# Patient Record
Sex: Female | Born: 1975 | Race: Black or African American | Hispanic: No | Marital: Married | Smoking: Never smoker
Health system: Southern US, Community
[De-identification: ages and names within clinical notes are randomized; demographics above are authoritative.]

---

## 1997-05-30 ENCOUNTER — Ambulatory Visit (HOSPITAL_COMMUNITY): Admission: RE | Admit: 1997-05-30 | Discharge: 1997-05-30 | Payer: Self-pay | Admitting: Obstetrics

## 1997-11-13 ENCOUNTER — Encounter (HOSPITAL_COMMUNITY): Admission: RE | Admit: 1997-11-13 | Discharge: 1997-11-20 | Payer: Self-pay | Admitting: Obstetrics & Gynecology

## 1997-11-18 ENCOUNTER — Inpatient Hospital Stay (HOSPITAL_COMMUNITY): Admission: AD | Admit: 1997-11-18 | Discharge: 1997-11-20 | Payer: Self-pay | Admitting: *Deleted

## 1999-08-19 ENCOUNTER — Other Ambulatory Visit: Admission: RE | Admit: 1999-08-19 | Discharge: 1999-08-19 | Payer: Self-pay | Admitting: Obstetrics and Gynecology

## 2000-02-25 ENCOUNTER — Inpatient Hospital Stay (HOSPITAL_COMMUNITY): Admission: AD | Admit: 2000-02-25 | Discharge: 2000-02-27 | Payer: Self-pay | Admitting: Obstetrics and Gynecology

## 2001-08-18 ENCOUNTER — Emergency Department (HOSPITAL_COMMUNITY): Admission: EM | Admit: 2001-08-18 | Discharge: 2001-08-18 | Payer: Self-pay | Admitting: Emergency Medicine

## 2002-01-08 ENCOUNTER — Encounter: Payer: Self-pay | Admitting: Emergency Medicine

## 2002-01-08 ENCOUNTER — Emergency Department (HOSPITAL_COMMUNITY): Admission: EM | Admit: 2002-01-08 | Discharge: 2002-01-08 | Payer: Self-pay | Admitting: Emergency Medicine

## 2003-03-05 ENCOUNTER — Emergency Department (HOSPITAL_COMMUNITY): Admission: EM | Admit: 2003-03-05 | Discharge: 2003-03-05 | Payer: Self-pay | Admitting: Emergency Medicine

## 2003-08-21 ENCOUNTER — Other Ambulatory Visit: Admission: RE | Admit: 2003-08-21 | Discharge: 2003-08-21 | Payer: Self-pay | Admitting: Family Medicine

## 2003-09-29 ENCOUNTER — Encounter: Admission: RE | Admit: 2003-09-29 | Discharge: 2003-09-29 | Payer: Self-pay | Admitting: Gastroenterology

## 2004-03-25 ENCOUNTER — Other Ambulatory Visit: Admission: RE | Admit: 2004-03-25 | Discharge: 2004-03-25 | Payer: Self-pay | Admitting: Family Medicine

## 2007-05-06 ENCOUNTER — Emergency Department (HOSPITAL_COMMUNITY): Admission: EM | Admit: 2007-05-06 | Discharge: 2007-05-06 | Payer: Self-pay | Admitting: Family Medicine

## 2007-05-10 ENCOUNTER — Emergency Department (HOSPITAL_COMMUNITY): Admission: EM | Admit: 2007-05-10 | Discharge: 2007-05-10 | Payer: Self-pay | Admitting: Family Medicine

## 2010-09-13 NOTE — H&P (Signed)
[facility]  Patient:    Carolyn Salas, Carolyn Salas                        MRN: [id number] Adm. Date:  [id number] Attending:  Pleas Koch Dictator:   Wynelle Bourgeois, P.A.                         History and Physical  HISTORY OF PRESENT ILLNESS:   Eknoor is a 35 year old G2, para 1-0-0-1 at 40-1/7ths weeks, who presents with complaints of uterine contractions every 2-3 minutes since 3 a.m.  She denies any leaking or bleeding and reports positive fetal movement.  The pregnancy has been followed by Dr. Pennie Rushing at [facility] and has been remarkable for: 1. A history of group B strep previous pregnancy.  2. Allergic to penicillin.  3. Unknown last menstrual period.  4. Rubella nonimmune.  PRENATAL LABORATORIES:        Hemoglobin 12.4, hematocrit 36.3, platelets 245. Blood type A positive.  Antibody screen negative.  Sickle cell trait negative. RPR nonreactive.  Rubella nonimmune.  HBsAg negative.  HIV nonreactive. Gonorrhea negative.  Chlamydia negative.  HSV negative culture.  AFP free beta within normal limits.  Glucose challenge within normal limits.  OBSTETRICAL HISTORY:          Remarkable for a spontaneous vaginal delivery in July 1999, of a viable female infant named Carlena Sax at [redacted] weeks gestation weighing 7 pounds 5 ounces after 12 hours of labor, complicated by beta strep.  MEDICAL HISTORY:              Remarkable for childhood varicella and a remote history of anemia and constipation.  FAMILY HISTORY:               Remarkable only for maternal grandmother with hypertension.  GENETIC HISTORY:              Unremarkable.  SOCIAL HISTORY:               The patient is married to Dwana Melena who is involved and supportive.  She is of the [place] faith.  She works as a Haematologist.  She denies any alcohol, tobacco or drug use.  PHYSICAL EXAMINATION:  VITAL SIGNS:                  Temperature 98.3, 72, 16 and 116/70.  HEENT:                         Within normal limits.  NECK:                         Thyroid normal, not enlarged.  BREASTS:                      Soft, nontender, no masses.  CHEST:                        Clear to auscultation bilaterally.  HEART RATE:                   Regular rate and rhythm.  ABDOMEN:                      Gravid at 39 cm, vertex to [place].  Fetal monitor denotes borderline reactive fetal heart rate tracing with  baseline 130-140 with accelerations to 150, no decelerations.  Uterine contractions every 2-3 minutes strong.  PELVIC:                       Cervical exam is 3-4 cm, 90% effaced, -1 station with a bulging bag of water and a vertex presentation.  EXTREMITIES:                  Within normal limits.  ASSESSMENT:                   1. Intrauterine pregnancy at 40-1/7ths weeks.                               2. Labor.                               3. History of group B Streptococcus with                                  penicillin allergy.  PLAN:                         1. Admit to birthing suite, Dr. Elliot Gault notified.                               2. Routine M.D. orders.                               3. Epidural when 4 cm.                               4. Clindamycin prophylaxis for group B strep and                                  further orders to be determined later. DD:  02/25/00 TD:  02/25/00 Job: [id number]

## 2016-06-19 ENCOUNTER — Other Ambulatory Visit: Payer: Self-pay | Admitting: Obstetrics and Gynecology

## 2016-06-19 DIAGNOSIS — R928 Other abnormal and inconclusive findings on diagnostic imaging of breast: Secondary | ICD-10-CM

## 2016-07-22 ENCOUNTER — Other Ambulatory Visit: Payer: Self-pay

## 2016-07-23 ENCOUNTER — Ambulatory Visit
Admission: RE | Admit: 2016-07-23 | Discharge: 2016-07-23 | Disposition: A | Discharge status: Home / Self Care | Payer: BC Managed Care – PPO | Source: Ambulatory Visit | Attending: Obstetrics and Gynecology | Admitting: Obstetrics and Gynecology

## 2016-07-23 DIAGNOSIS — R928 Other abnormal and inconclusive findings on diagnostic imaging of breast: Secondary | ICD-10-CM

## 2019-11-23 ENCOUNTER — Other Ambulatory Visit: Payer: Self-pay | Admitting: Obstetrics and Gynecology

## 2019-11-23 DIAGNOSIS — R928 Other abnormal and inconclusive findings on diagnostic imaging of breast: Secondary | ICD-10-CM

## 2020-01-11 ENCOUNTER — Ambulatory Visit: Payer: BC Managed Care – PPO

## 2020-01-11 ENCOUNTER — Ambulatory Visit
Admission: RE | Admit: 2020-01-11 | Discharge: 2020-01-11 | Disposition: A | Discharge status: Home / Self Care | Payer: BC Managed Care – PPO | Source: Ambulatory Visit | Attending: Obstetrics and Gynecology | Admitting: Obstetrics and Gynecology

## 2020-01-11 ENCOUNTER — Other Ambulatory Visit: Payer: Self-pay

## 2020-01-11 DIAGNOSIS — R928 Other abnormal and inconclusive findings on diagnostic imaging of breast: Secondary | ICD-10-CM

## 2021-10-25 ENCOUNTER — Ambulatory Visit: Payer: Self-pay | Admitting: Internal Medicine

## 2021-10-30 NOTE — Progress Notes (Unsigned)
New Patient Note  RE: Carolyn Salas MRN: [id number] DOB: October 17, 1975 Date of Office Visit: 10/31/2021  Consult requested by: Deatra James, MD Primary care provider: Zelphia Cairo, MD  Chief Complaint: No chief complaint on file.  History of Present Illness: I had the pleasure of seeing Carolyn Salas for initial evaluation at the [facility] on 10/30/2021. She is a 46 y.o. female, who is referred here by Zelphia Cairo, MD for the evaluation of allergic rhinitis.  She reports symptoms of ***. Symptoms have been going on for *** years. The symptoms are present *** all year around with worsening in ***. Other triggers include exposure to ***. Anosmia: ***. Headache: ***. She has used *** with ***fair improvement in symptoms. Sinus infections: ***. Previous work up includes: ***. Previous ENT evaluation: ***. Previous sinus imaging: ***. History of nasal polyps: ***. Last eye exam: ***. History of reflux: ***.  Assessment and Plan: Carolyn Salas is a 46 y.o. female with: No problem-specific Assessment & Plan notes found for this encounter.  No follow-ups on file.  No orders of the defined types were placed in this encounter.  Lab Orders  No laboratory test(s) ordered today    Other allergy screening: Asthma: {Blank single:19197::"yes","no"} Rhino conjunctivitis: {Blank single:19197::"yes","no"} Food allergy: {Blank single:19197::"yes","no"} Medication allergy: {Blank single:19197::"yes","no"} Hymenoptera allergy: {Blank single:19197::"yes","no"} Urticaria: {Blank single:19197::"yes","no"} Eczema:{Blank single:19197::"yes","no"} History of recurrent infections suggestive of immunodeficency: {Blank single:19197::"yes","no"}  Diagnostics: Spirometry:  Tracings reviewed. Her effort: {Blank single:19197::"Good reproducible efforts.","It was hard to get consistent efforts and there is a question as to whether this reflects a maximal maneuver.","Poor effort, data can  not be interpreted."} FVC: ***L FEV1: ***L, ***% predicted FEV1/FVC ratio: ***% Interpretation: {Blank single:19197::"Spirometry consistent with mild obstructive disease","Spirometry consistent with moderate obstructive disease","Spirometry consistent with severe obstructive disease","Spirometry consistent with possible restrictive disease","Spirometry consistent with mixed obstructive and restrictive disease","Spirometry uninterpretable due to technique","Spirometry consistent with normal pattern","No overt abnormalities noted given today's efforts"}.  Please see scanned spirometry results for details.  Skin Testing: {Blank single:19197::"Select foods","Environmental allergy panel","Environmental allergy panel and select foods","Food allergy panel","None","Deferred due to recent antihistamines use"}. *** Results discussed with patient/family.   Past Medical History: There are no problems to display for this patient.  No past medical history on file. Past Surgical History: No past surgical history on file. Medication List:  No current outpatient medications on file.   No current facility-administered medications for this visit.   Allergies: Not on File Social History: Social History   Socioeconomic History  . Marital status: Divorced    Spouse name: Not on file  . Number of children: Not on file  . Years of education: Not on file  . Highest education level: Not on file  Occupational History  . Not on file  Tobacco Use  . Smoking status: Not on file  . Smokeless tobacco: Not on file  Substance and Sexual Activity  . Alcohol use: Not on file  . Drug use: Not on file  . Sexual activity: Not on file  Other Topics Concern  . Not on file  Social History Narrative  . Not on file   Social Determinants of Health   Financial Resource Strain: Not on file  Food Insecurity: Not on file  Transportation Needs: Not on file  Physical Activity: Not on file  Stress: Not on file   Social Connections: Not on file   Lives in a ***. Smoking: *** Occupation: ***  Environmental History: Water Damage/mildew in the house: [employer]  single:19197::"yes","no"} Carpet in the family room: {Blank single:19197::"yes","no"} Carpet in the bedroom: {Blank single:19197::"yes","no"} Heating: {Blank single:19197::"electric","gas","heat pump"} Cooling: {Blank single:19197::"central","window","heat pump"} Pet: {Blank single:19197::"yes ***","no"}  Family History: No family history on file. Problem                               Relation Asthma                                   *** Eczema                                *** Food allergy                          *** Allergic rhino conjunctivitis     ***  Review of Systems  Constitutional:  Negative for appetite change, chills, fever and unexpected weight change.  HENT:  Negative for congestion and rhinorrhea.   Eyes:  Negative for itching.  Respiratory:  Negative for cough, chest tightness, shortness of breath and wheezing.   Cardiovascular:  Negative for chest pain.  Gastrointestinal:  Negative for abdominal pain.  Genitourinary:  Negative for difficulty urinating.  Skin:  Negative for rash.  Neurological:  Negative for headaches.   Objective: There were no vitals taken for this visit. There is no height or weight on file to calculate BMI. Physical Exam Vitals and nursing note reviewed.  Constitutional:      Appearance: Normal appearance. She is well-developed.  HENT:     Head: Normocephalic and atraumatic.     Right Ear: Tympanic membrane and external ear normal.     Left Ear: Tympanic membrane and external ear normal.     Nose: Nose normal.     Mouth/Throat:     Mouth: Mucous membranes are moist.     Pharynx: Oropharynx is clear.  Eyes:     Conjunctiva/sclera: Conjunctivae normal.  Cardiovascular:     Rate and Rhythm: Normal rate and regular rhythm.     Heart sounds: Normal heart sounds. No murmur heard.    No  friction rub. No gallop.  Pulmonary:     Effort: Pulmonary effort is normal.     Breath sounds: Normal breath sounds. No wheezing, rhonchi or rales.  Musculoskeletal:     Cervical back: Neck supple.  Skin:    General: Skin is warm.     Findings: No rash.  Neurological:     Mental Status: She is alert and oriented to person, place, and time.  Psychiatric:        Behavior: Behavior normal.  The plan was reviewed with the patient/family, and all questions/concerned were addressed.  It was my pleasure to see Carolyn Salas today and participate in her care. Please feel free to contact me with any questions or concerns.  Sincerely,  Wyline Mood, DO Allergy & Immunology  [facility] [place] office: [phone] [facility] office: [phone]

## 2021-10-31 ENCOUNTER — Encounter: Payer: Self-pay | Admitting: Allergy

## 2021-10-31 ENCOUNTER — Ambulatory Visit (INDEPENDENT_AMBULATORY_CARE_PROVIDER_SITE_OTHER): Payer: BC Managed Care – PPO | Admitting: Allergy

## 2021-10-31 ENCOUNTER — Other Ambulatory Visit: Payer: Self-pay

## 2021-10-31 VITALS — BP 120/72 | HR 68 | Temp 98.0°F | Resp 20 | Ht 67.0 in | Wt 187.4 lb

## 2021-10-31 DIAGNOSIS — J310 Chronic rhinitis: Secondary | ICD-10-CM | POA: Diagnosis not present

## 2021-10-31 MED ORDER — RYALTRIS 665-25 MCG/ACT NA SUSP: 1.0000 | Freq: Two times a day (BID) | NASAL | 5 refills | Status: DC

## 2021-10-31 NOTE — Patient Instructions (Addendum)
Today's skin testing showed: Negative to indoor/outdoor allergens but positive control was negative questioning the validity of results.   Results given.  Rhinitis Get bloodwork and will make additional recommendations based on results.  We are ordering labs, so please allow 1-2 weeks for the results to come back. With the newly implemented Cures Act, the labs might be visible to you at the same time that they become visible to me. However, I will not address the results until all of the results are back, so please be patient.  In the meantime, continue recommendations in your patient instructions, including avoidance measures (if applicable), until you hear from me. Use over the counter antihistamines such as Zyrtec (cetirizine), Claritin (loratadine), Allegra (fexofenadine), or Xyzal (levocetirizine) daily as needed. May take twice a day during allergy flares. May switch antihistamines every few months. Use plain antihistamines without the decongestants.  Start Ryaltris (olopatadine + mometasone nasal spray combination) 1-2 sprays per nostril twice a day. Sample given. This replaces your other nasal sprays. If this works well for you, then have [facility] ship the medication to your home - prescription already sent in.  Nasal saline spray (i.e., Simply Saline) or nasal saline lavage (i.e., NeilMed) is recommended as needed and prior to medicated nasal sprays.  Follow up in 4 months or sooner if needed.

## 2021-10-31 NOTE — Assessment & Plan Note (Addendum)
Rhinoconjunctivitis symptoms mainly in the spring for the last 10 years.  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  Tried Allegra, Xyzal and Flonase with minimal benefit.  No prior allergy/ENT evaluation.  Today's skin prick testing showed: Negative to indoor/outdoor allergens but positive control was negative questioning the validity of results.   Get bloodwork and will make additional recommendations based on results.   Use over the counter antihistamines such as Zyrtec (cetirizine), Claritin (loratadine), Allegra (fexofenadine), or Xyzal (levocetirizine) daily as needed. May take twice a day during allergy flares. May switch antihistamines every few months.  Use plain antihistamines without the decongestants.   Start Ryaltris (olopatadine + mometasone nasal spray combination) 1-2 sprays per nostril twice a day. Sample given.  This replaces your other nasal sprays.  If this works well for you, then have [facility] ship the medication to your home - prescription already sent in.   Nasal saline spray (i.e., Simply Saline) or nasal saline lavage (i.e., NeilMed) is recommended as needed and prior to medicated nasal sprays.

## 2021-11-03 LAB — ALLERGENS W/TOTAL IGE AREA 2
Alternaria Alternata IgE: <0.1 kU/L
Aspergillus Fumigatus IgE: <0.1 kU/L
Bermuda Grass IgE: <0.1 kU/L
Cat Dander IgE: <0.1 kU/L
Cedar, Mountain IgE: 1.43 kU/L — AB
Cladosporium Herbarum IgE: <0.1 kU/L
Cockroach, German IgE: <0.1 kU/L
Common Silver Birch IgE: <0.1 kU/L
Cottonwood IgE: <0.1 kU/L
D Farinae IgE: <0.1 kU/L
D Pteronyssinus IgE: <0.1 kU/L
Dog Dander IgE: <0.1 kU/L
Elm, American IgE: <0.1 kU/L
IgE (Immunoglobulin E), Serum: 12 IU/mL (ref 6–495)
Johnson Grass IgE: <0.1 kU/L
Maple/Box Elder IgE: <0.1 kU/L
Mouse Urine IgE: <0.1 kU/L
Oak, White IgE: <0.1 kU/L
Pecan, Hickory IgE: <0.1 kU/L
Penicillium Chrysogen IgE: <0.1 kU/L
Pigweed, Rough IgE: <0.1 kU/L
Ragweed, Short IgE: <0.1 kU/L
Sheep Sorrel IgE Qn: <0.1 kU/L
Timothy Grass IgE: <0.1 kU/L
White Mulberry IgE: <0.1 kU/L

## 2021-11-19 ENCOUNTER — Encounter: Payer: Self-pay | Admitting: *Deleted

## 2021-12-17 IMAGING — MG MM DIGITAL DIAGNOSTIC UNILAT*L* W/ TOMO W/ CAD
6 series · 6 of 18 positions shown · non-contrast
Comparison: Previous exam(s).

CLINICAL DATA: Patient recalled from screening for left breast
asymmetry.

EXAM:
DIGITAL DIAGNOSTIC UNILATERAL LEFT MAMMOGRAM WITH TOMO AND CAD

[L MLO synth-2D (1 of 2)]
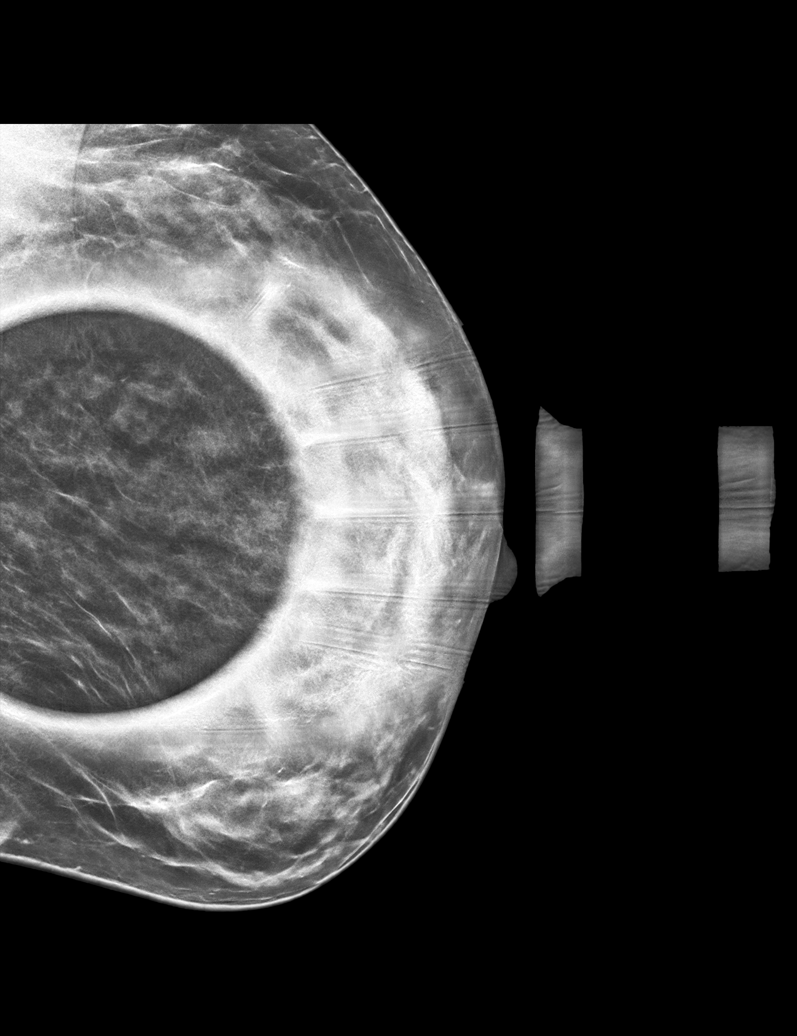

[L MLO synth-2D (2 of 2)]
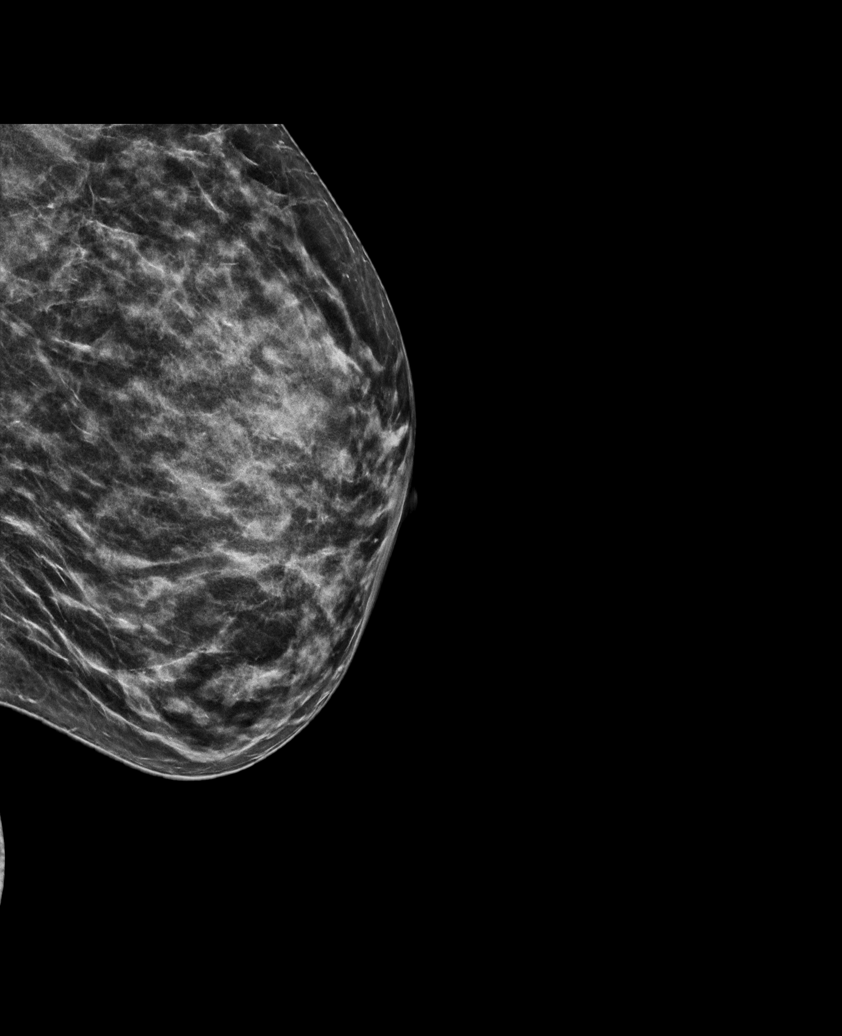

[L ML synth-2D]
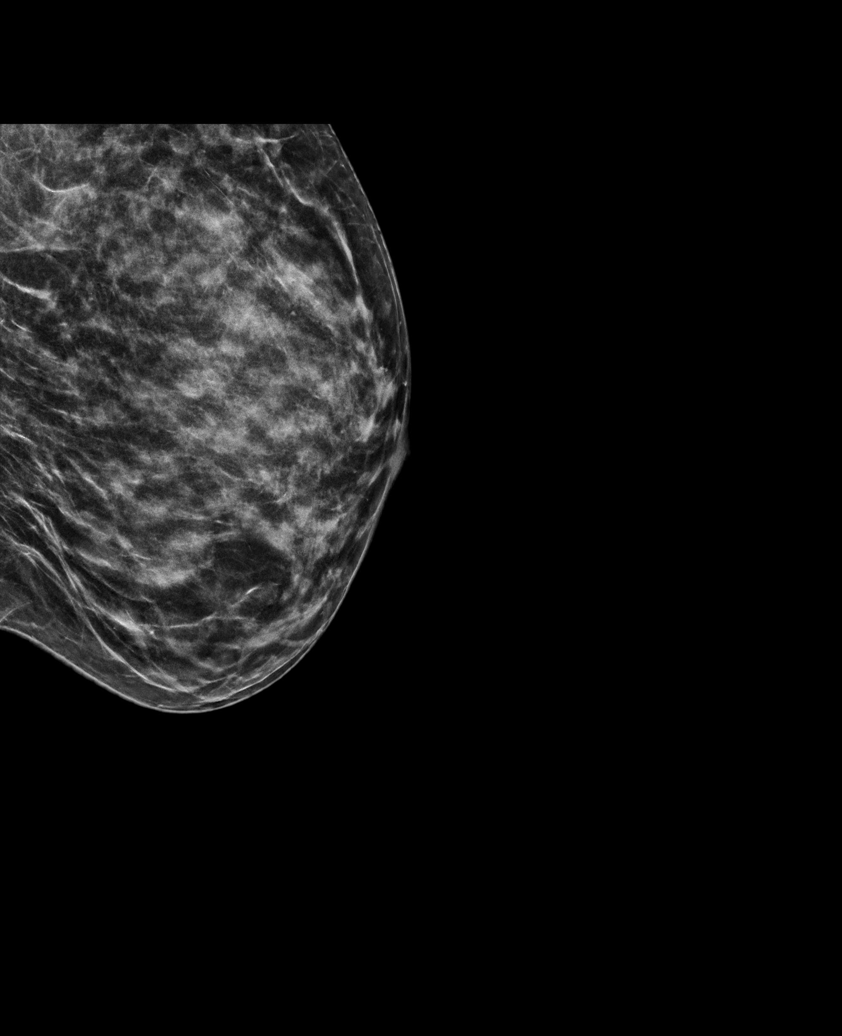

[L MLO tomo (1 of 2) · tomo slice 27/53.0]
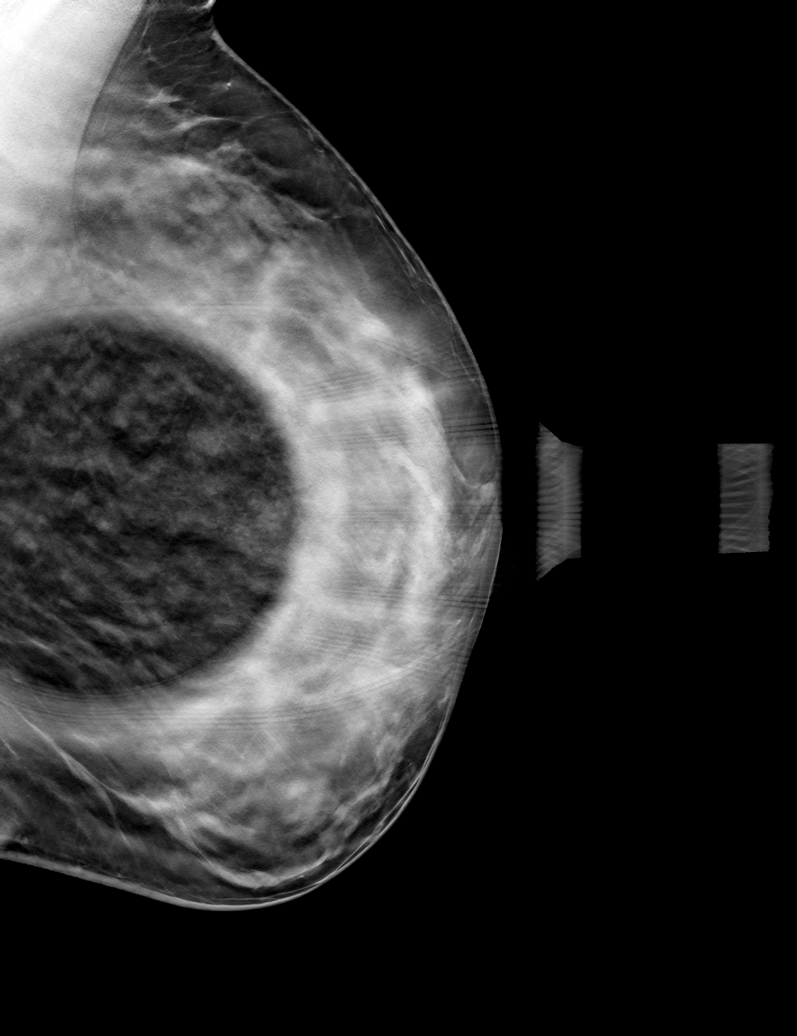

[L ML tomo · tomo slice 29/56.0]
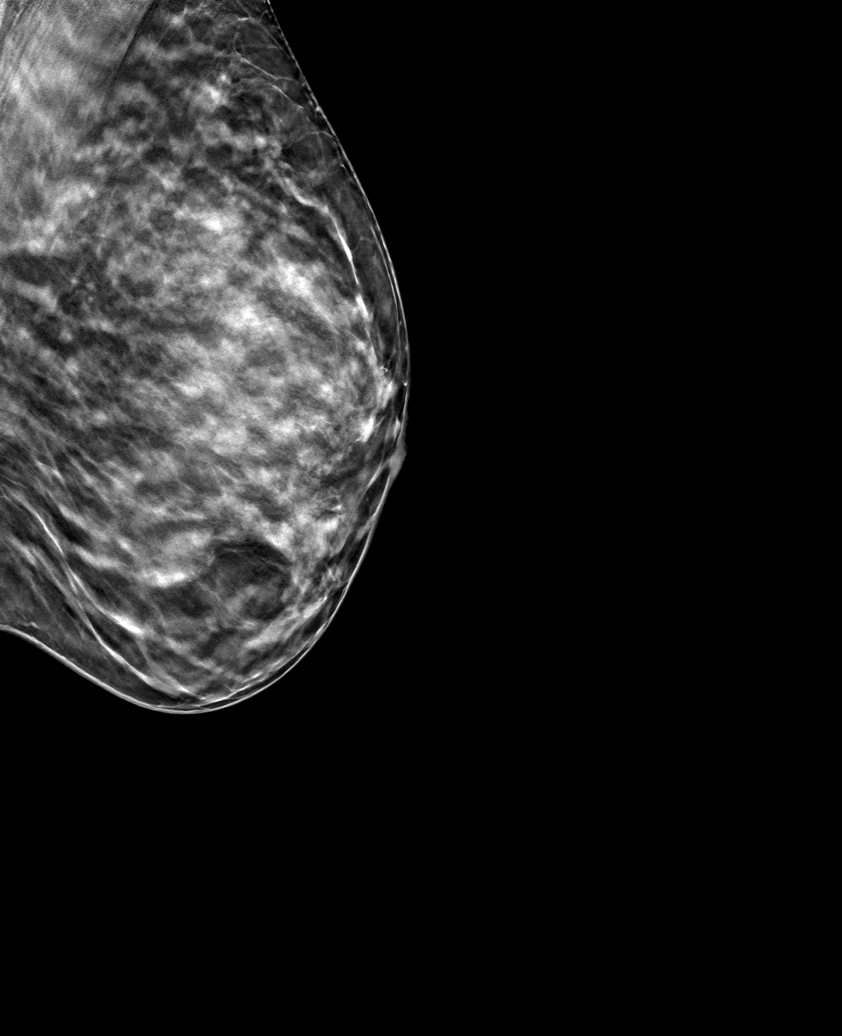

[L MLO tomo (2 of 2) · tomo slice 28/55.0]
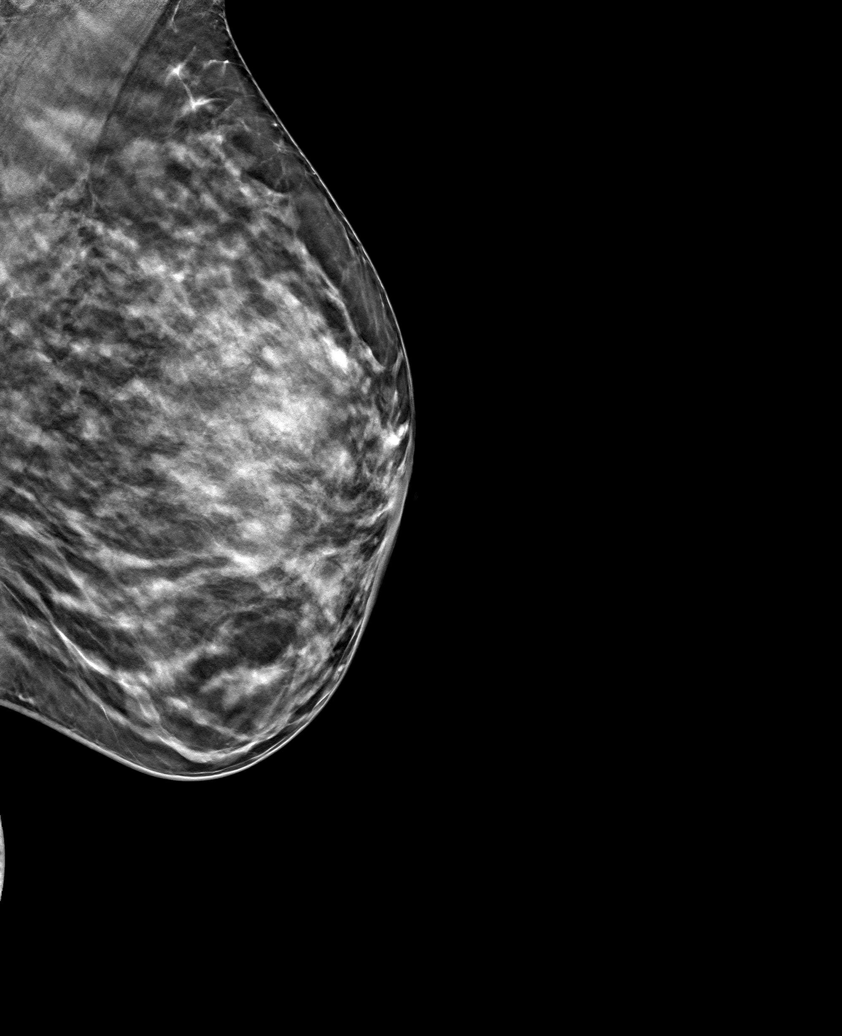

[6 of 18 positions shown; findings below may reference images not displayed]

ACR Breast Density Category c: The breast tissue is heterogeneously
dense, which may obscure small masses.
FINDINGS: Questioned asymmetry within the left breast on the MLO view
posterior depth resolved with additional imaging compatible with
dense overlapping fibroglandular tissue.

Mammographic images were processed with CAD.
IMPRESSION: No mammographic evidence for malignancy.

RECOMMENDATION:
Screening mammogram in one year.(Code:K0-8-5KU)

I have discussed the findings and recommendations with the patient.
If applicable, a reminder letter will be sent to the patient
regarding the next appointment.

BI-RADS CATEGORY  2: Benign.

## 2022-03-02 NOTE — Progress Notes (Deleted)
Follow Up Note  RE: Carolyn Salas MRN: [id number] DOB: July 29, 1975 Date of Office Visit: 03/03/2022  Referring provider: Marylynn Pearson, MD Primary care provider: Donald Prose, MD  Chief Complaint: No chief complaint on file.  History of Present Illness: I had the pleasure of seeing Carolyn Salas for a follow up visit at the [facility] on 03/02/2022. She is a 46 y.o. female, who is being followed for allergic rhinitis. Her previous allergy office visit was on 10/31/2021 with Dr. Maudie Mercury. Today is a regular follow up visit.  Blood work positive to tree pollen.  Chronic rhinitis Rhinoconjunctivitis symptoms mainly in the spring for the last 10 years.  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  Tried Allegra, Xyzal and Flonase with minimal benefit.  No prior allergy/ENT evaluation. Today's skin prick testing showed: Negative to indoor/outdoor allergens but positive control was negative questioning the validity of results.  Get bloodwork and will make additional recommendations based on results.  Use over the counter antihistamines such as Zyrtec (cetirizine), Claritin (loratadine), Allegra (fexofenadine), or Xyzal (levocetirizine) daily as needed. May take twice a day during allergy flares. May switch antihistamines every few months. Use plain antihistamines without the decongestants.  Start Ryaltris (olopatadine + mometasone nasal spray combination) 1-2 sprays per nostril twice a day. Sample given. This replaces your other nasal sprays. If this works well for you, then have [facility] ship the medication to your home - prescription already sent in.  Nasal saline spray (i.e., Simply Saline) or nasal saline lavage (i.e., NeilMed) is recommended as needed and prior to medicated nasal sprays.   Return in about 4 months (around 03/03/2022).  Assessment and Plan: Carolyn Salas is a 46 y.o. female with: No problem-specific Assessment & Plan notes found for this encounter.  No  follow-ups on file.  No orders of the defined types were placed in this encounter.  Lab Orders  No laboratory test(s) ordered today    Diagnostics: Spirometry:  Tracings reviewed. Her effort: {Blank single:19197::"Good reproducible efforts.","It was hard to get consistent efforts and there is a question as to whether this reflects a maximal maneuver.","Poor effort, data can not be interpreted."} FVC: ***L FEV1: ***L, ***% predicted FEV1/FVC ratio: ***% Interpretation: {Blank single:19197::"Spirometry consistent with mild obstructive disease","Spirometry consistent with moderate obstructive disease","Spirometry consistent with severe obstructive disease","Spirometry consistent with possible restrictive disease","Spirometry consistent with mixed obstructive and restrictive disease","Spirometry uninterpretable due to technique","Spirometry consistent with normal pattern","No overt abnormalities noted given today's efforts"}.  Please see scanned spirometry results for details.  Skin Testing: {Blank single:19197::"Select foods","Environmental allergy panel","Environmental allergy panel and select foods","Food allergy panel","None","Deferred due to recent antihistamines use"}. *** Results discussed with patient/family.   Medication List:  Current Outpatient Medications  Medication Sig Dispense Refill  . cholecalciferol (VITAMIN D3) 25 MCG (1000 UNIT) tablet 1 tablet    . L-Lysine 1000 MG TABS 1 tablet    . Olopatadine-Mometasone (RYALTRIS) [id number] MCG/ACT SUSP Place 1-2 sprays into the nose in the morning and at bedtime. 29 g 5  . valACYclovir (VALTREX) 1000 MG tablet 2 tablets     No current facility-administered medications for this visit.   Allergies: Allergies  Allergen Reactions  . Penicillins Other (See Comments)   I reviewed her past medical history, social history, family history, and environmental history and no significant changes have been reported from her previous  visit.  Review of Systems  Constitutional:  Negative for appetite change, chills, fever and unexpected weight change.  HENT:  Negative  for congestion and rhinorrhea.   Eyes:  Negative for itching.  Respiratory:  Negative for cough, chest tightness, shortness of breath and wheezing.   Cardiovascular:  Negative for chest pain.  Gastrointestinal:  Negative for abdominal pain.  Genitourinary:  Negative for difficulty urinating.  Skin:  Negative for rash.  Allergic/Immunologic: Positive for environmental allergies.  Neurological:  Negative for headaches.    Objective: There were no vitals taken for this visit. There is no height or weight on file to calculate BMI. Physical Exam Vitals and nursing note reviewed.  Constitutional:      Appearance: Normal appearance. She is well-developed.  HENT:     Head: Normocephalic and atraumatic.     Right Ear: Tympanic membrane and external ear normal.     Left Ear: Tympanic membrane and external ear normal.     Nose: Nose normal.     Mouth/Throat:     Mouth: Mucous membranes are moist.     Pharynx: Oropharynx is clear.  Eyes:     Conjunctiva/sclera: Conjunctivae normal.  Cardiovascular:     Rate and Rhythm: Normal rate and regular rhythm.     Heart sounds: Normal heart sounds. No murmur heard.    No friction rub. No gallop.  Pulmonary:     Effort: Pulmonary effort is normal.     Breath sounds: Normal breath sounds. No wheezing, rhonchi or rales.  Musculoskeletal:     Cervical back: Neck supple.  Skin:    General: Skin is warm.     Findings: No rash.  Neurological:     Mental Status: She is alert and oriented to person, place, and time.  Psychiatric:        Behavior: Behavior normal.   Previous notes and tests were reviewed. The plan was reviewed with the patient/family, and all questions/concerned were addressed.  It was my pleasure to see Carolyn Salas today and participate in her care. Please feel free to contact me with any questions or  concerns.  Sincerely,  Rexene Alberts, DO Allergy & Immunology  [facility] office: [place] office: [phone]

## 2022-03-03 ENCOUNTER — Ambulatory Visit: Payer: BC Managed Care – PPO | Admitting: Allergy

## 2022-03-03 DIAGNOSIS — J301 Allergic rhinitis due to pollen: Secondary | ICD-10-CM

## 2022-03-03 DIAGNOSIS — J309 Allergic rhinitis, unspecified: Secondary | ICD-10-CM

## 2022-04-23 ENCOUNTER — Other Ambulatory Visit: Payer: Self-pay | Admitting: Obstetrics and Gynecology

## 2022-04-23 DIAGNOSIS — Z1239 Encounter for other screening for malignant neoplasm of breast: Secondary | ICD-10-CM

## 2022-06-26 ENCOUNTER — Other Ambulatory Visit: Payer: Self-pay | Admitting: Obstetrics and Gynecology

## 2022-06-26 DIAGNOSIS — Z1231 Encounter for screening mammogram for malignant neoplasm of breast: Secondary | ICD-10-CM

## 2023-01-17 ENCOUNTER — Emergency Department (HOSPITAL_COMMUNITY): Payer: BC Managed Care – PPO

## 2023-01-17 ENCOUNTER — Inpatient Hospital Stay (HOSPITAL_COMMUNITY)
Admission: EM | Admit: 2023-01-17 | Discharge: 2023-01-19 | DRG: 918 | Disposition: A | Discharge status: Other Acute Inpatient Hospital | Payer: BC Managed Care – PPO | Attending: Internal Medicine | Admitting: Internal Medicine

## 2023-01-17 ENCOUNTER — Encounter (HOSPITAL_COMMUNITY): Payer: Self-pay

## 2023-01-17 DIAGNOSIS — E876 Hypokalemia: Secondary | ICD-10-CM

## 2023-01-17 DIAGNOSIS — F259 Schizoaffective disorder, unspecified: Secondary | ICD-10-CM | POA: Diagnosis present

## 2023-01-17 DIAGNOSIS — Z6828 Body mass index (BMI) 28.0-28.9, adult: Secondary | ICD-10-CM

## 2023-01-17 DIAGNOSIS — F322 Major depressive disorder, single episode, severe without psychotic features: Secondary | ICD-10-CM | POA: Diagnosis present

## 2023-01-17 DIAGNOSIS — R7401 Elevation of levels of liver transaminase levels: Principal | ICD-10-CM

## 2023-01-17 DIAGNOSIS — Z9151 Personal history of suicidal behavior: Secondary | ICD-10-CM

## 2023-01-17 DIAGNOSIS — R45851 Suicidal ideations: Secondary | ICD-10-CM | POA: Diagnosis not present

## 2023-01-17 DIAGNOSIS — T391X1A Poisoning by 4-Aminophenol derivatives, accidental (unintentional), initial encounter: Secondary | ICD-10-CM | POA: Diagnosis present

## 2023-01-17 DIAGNOSIS — T391X2A Poisoning by 4-Aminophenol derivatives, intentional self-harm, initial encounter: Secondary | ICD-10-CM | POA: Diagnosis not present

## 2023-01-17 DIAGNOSIS — W260XXA Contact with knife, initial encounter: Secondary | ICD-10-CM | POA: Diagnosis present

## 2023-01-17 DIAGNOSIS — E663 Overweight: Secondary | ICD-10-CM | POA: Diagnosis present

## 2023-01-17 LAB — MAGNESIUM: Magnesium: 2.1 mg/dL (ref 1.7–2.4)

## 2023-01-17 LAB — CBC WITH DIFFERENTIAL/PLATELET
Abs Immature Granulocytes: 0.01 10*3/uL (ref 0.00–0.07)
Basophils Absolute: 0 10*3/uL (ref 0.0–0.1)
Basophils Relative: 0 %
Eosinophils Absolute: 0 10*3/uL (ref 0.0–0.5)
Eosinophils Relative: 0 %
HCT: 49.7 % — ABNORMAL HIGH (ref 36.0–46.0)
Hemoglobin: 16.4 g/dL — ABNORMAL HIGH (ref 12.0–15.0)
Immature Granulocytes: 0 %
Lymphocytes Relative: 15 %
Lymphs Abs: 1 10*3/uL (ref 0.7–4.0)
MCH: 30.6 pg (ref 26.0–34.0)
MCHC: 33 g/dL (ref 30.0–36.0)
MCV: 92.7 fL (ref 80.0–100.0)
Monocytes Absolute: 0.3 10*3/uL (ref 0.1–1.0)
Monocytes Relative: 5 %
Neutro Abs: 5.5 10*3/uL (ref 1.7–7.7)
Neutrophils Relative %: 80 %
Platelets: 185 10*3/uL (ref 150–400)
RBC: 5.36 MIL/uL — ABNORMAL HIGH (ref 3.87–5.11)
RDW: 12.3 % (ref 11.5–15.5)
WBC: 6.9 10*3/uL (ref 4.0–10.5)
nRBC: 0 % (ref 0.0–0.2)

## 2023-01-17 LAB — COMPREHENSIVE METABOLIC PANEL
ALT: 1694 U/L — ABNORMAL HIGH (ref 0–44)
AST: 1204 U/L — ABNORMAL HIGH (ref 15–41)
Albumin: 4.1 g/dL (ref 3.5–5.0)
Alkaline Phosphatase: 70 U/L (ref 38–126)
Anion gap: 13 (ref 5–15)
BUN: 12 mg/dL (ref 6–20)
CO2: 21 mmol/L — ABNORMAL LOW (ref 22–32)
Calcium: 8.9 mg/dL (ref 8.9–10.3)
Chloride: 104 mmol/L (ref 98–111)
Creatinine, Ser: 0.87 mg/dL (ref 0.44–1.00)
GFR, Estimated: >60 mL/min (ref >60)
Glucose, Bld: 200 mg/dL — ABNORMAL HIGH (ref 70–99)
Potassium: 2.9 mmol/L — ABNORMAL LOW (ref 3.5–5.1)
Sodium: 138 mmol/L (ref 135–145)
Total Bilirubin: 1.6 mg/dL — ABNORMAL HIGH (ref 0.3–1.2)
Total Protein: 8.1 g/dL (ref 6.5–8.1)

## 2023-01-17 LAB — RAPID URINE DRUG SCREEN, HOSP PERFORMED
Amphetamines: NOT DETECTED
Barbiturates: NOT DETECTED
Benzodiazepines: NOT DETECTED
Cocaine: NOT DETECTED
Opiates: NOT DETECTED
Tetrahydrocannabinol: NOT DETECTED

## 2023-01-17 LAB — HEPATITIS PANEL, ACUTE
HCV Ab: NONREACTIVE
Hep A IgM: NONREACTIVE
Hep B C IgM: NONREACTIVE
Hepatitis B Surface Ag: NONREACTIVE

## 2023-01-17 LAB — LIPASE, BLOOD: Lipase: 26 U/L (ref 11–51)

## 2023-01-17 LAB — ETHANOL: Alcohol, Ethyl (B): <10 mg/dL (ref <10)

## 2023-01-17 LAB — APTT: aPTT: 24 seconds (ref 24–36)

## 2023-01-17 LAB — ACETAMINOPHEN LEVEL: Acetaminophen (Tylenol), Serum: <10 ug/mL — ABNORMAL LOW (ref 10–30)

## 2023-01-17 LAB — PREGNANCY, URINE: Preg Test, Ur: NEGATIVE

## 2023-01-17 LAB — AMMONIA: Ammonia: 27 umol/L (ref 9–35)

## 2023-01-17 LAB — SALICYLATE LEVEL: Salicylate Lvl: <7 mg/dL — ABNORMAL LOW (ref 7.0–30.0)

## 2023-01-17 LAB — PROTIME-INR
INR: 1.1 (ref 0.8–1.2)
Prothrombin Time: 14.4 seconds (ref 11.4–15.2)

## 2023-01-17 MED ORDER — DEXTROSE 5 % IV SOLN
15.0000 mg/kg/h | INTRAVENOUS | Status: AC
  Administered 2023-01-17 – 2023-01-18 (×3): 15 mg/kg/h via INTRAVENOUS
  Filled 2023-01-17 (×4): qty 90

## 2023-01-17 MED ORDER — LACTATED RINGERS IV SOLN: INTRAVENOUS | Status: DC

## 2023-01-17 MED ORDER — LACTATED RINGERS IV BOLUS
2000.0000 mL | Freq: Once | INTRAVENOUS | Status: AC
  Administered 2023-01-17: 2000 mL via INTRAVENOUS

## 2023-01-17 MED ORDER — MELATONIN 3 MG PO TABS: 3.0000 mg | ORAL_TABLET | Freq: Every evening | ORAL | Status: DC | PRN

## 2023-01-17 MED ORDER — ONDANSETRON HCL 4 MG/2ML IJ SOLN
4.0000 mg | Freq: Four times a day (QID) | INTRAMUSCULAR | Status: DC | PRN
  Administered 2023-01-17: 4 mg via INTRAVENOUS
  Filled 2023-01-17: qty 2

## 2023-01-17 MED ORDER — ACETYLCYSTEINE LOAD VIA INFUSION
150.0000 mg/kg | Freq: Once | INTRAVENOUS | Status: AC
  Administered 2023-01-17: 12300 mg via INTRAVENOUS
  Filled 2023-01-17: qty 404

## 2023-01-17 MED ORDER — POTASSIUM CHLORIDE 10 MEQ/100ML IV SOLN
10.0000 meq | Freq: Once | INTRAVENOUS | Status: AC
  Administered 2023-01-17: 10 meq via INTRAVENOUS
  Filled 2023-01-17: qty 100

## 2023-01-17 MED ORDER — IOHEXOL 300 MG/ML  SOLN
100.0000 mL | Freq: Once | INTRAMUSCULAR | Status: AC | PRN
  Administered 2023-01-17: 100 mL via INTRAVENOUS

## 2023-01-17 MED ORDER — POTASSIUM CHLORIDE CRYS ER 20 MEQ PO TBCR
40.0000 meq | EXTENDED_RELEASE_TABLET | Freq: Once | ORAL | Status: AC
  Administered 2023-01-18: 40 meq via ORAL
  Filled 2023-01-17: qty 2

## 2023-01-17 NOTE — ED Notes (Signed)
Patient transported to room 1504 with husband and safety sitter Sherrilyn Rist, [place].

## 2023-01-17 NOTE — ED Provider Notes (Signed)
[facility] [facility] AT [facility] Provider Note   CSN: [id number] Arrival date & time: 01/17/23  1027     History  Chief Complaint  Patient presents with   Suicidal    ANNIQUE STICKER is a 47 y.o. female.  47 year old female here after intentional harm to herself prior to arrival.  Superficial lacerations to her neck as well as her wrists were made by her today.  According to husband who is at bedside she has had work stress going on at this time.  He denies any prior history of psychiatric illness.  She denies any use of alcohol or illicit drugs.  Denies any auditory visual hallucinations.  No homicidal ideations.  When questioned directly about it this was a suicide attempt she states that she just wants things to go away.  Has been also has noted some paranoia from his wife as well.       Home Medications Prior to Admission medications   Medication Sig Start Date End Date Taking? Authorizing Provider  cholecalciferol (VITAMIN D3) 25 MCG (1000 UNIT) tablet 1 tablet    [provider]  L-Lysine 1000 MG TABS 1 tablet    [provider]  Olopatadine-Mometasone (RYALTRIS) [phone] MCG/ACT SUSP Place 1-2 sprays into the nose in the morning and at bedtime. 10/31/21   Ellamae Sia, DO  valACYclovir (VALTREX) 1000 MG tablet 2 tablets    [provider]      Allergies    Penicillins    Review of Systems   Review of Systems  All other systems reviewed and are negative.   Physical Exam Updated Vital Signs BP (!) 123/98 (BP Location: Right Arm)   Pulse (!) 122   Temp 97.9 F (36.6 C) (Oral)   Resp 18   SpO2 99%  Physical Exam Vitals and nursing note reviewed.  Constitutional:      General: She is not in acute distress.    Appearance: Normal appearance. She is well-developed. She is not toxic-appearing.  HENT:     Head: Normocephalic and atraumatic.  Eyes:     General: Lids are normal.     Conjunctiva/sclera: Conjunctivae  normal.     Pupils: Pupils are equal, round, and reactive to light.  Neck:     Thyroid: No thyroid mass.     Trachea: No tracheal deviation.   Cardiovascular:     Rate and Rhythm: Normal rate and regular rhythm.     Heart sounds: Normal heart sounds. No murmur heard.    No gallop.  Pulmonary:     Effort: Pulmonary effort is normal. No respiratory distress.     Breath sounds: Normal breath sounds. No stridor. No decreased breath sounds, wheezing, rhonchi or rales.  Abdominal:     General: There is no distension.     Palpations: Abdomen is soft.     Tenderness: There is no abdominal tenderness. There is no rebound.  Musculoskeletal:        General: No tenderness. Normal range of motion.     Cervical back: Normal range of motion and neck supple.     Comments: Nonsuturable superficial lacerations noted to volar aspect of distal left forearm.  Skin:    General: Skin is warm and dry.     Findings: No abrasion or rash.  Neurological:     Mental Status: She is alert and oriented to person, place, and time. Mental status is at baseline.     GCS: GCS eye subscore  is 4. GCS verbal subscore is 5. GCS motor subscore is 6.     Cranial Nerves: Cranial nerves are intact. No cranial nerve deficit.     Sensory: No sensory deficit.     Motor: Motor function is intact.  Psychiatric:        Attention and Perception: Attention normal.        Mood and Affect: Affect is flat.        Speech: Speech normal.        Behavior: Behavior is slowed and withdrawn.     ED Results / Procedures / Treatments   Labs (all labs ordered are listed, but only abnormal results are displayed) Labs Reviewed - No data to display  EKG None  Radiology No results found.  Procedures Procedures    Medications Ordered in ED Medications - No data to display  ED Course/ Medical Decision Making/ A&P                                 Medical Decision Making Amount and/or Complexity of Data Reviewed Labs:  ordered. Radiology: ordered.  Risk Prescription drug management.    12:05 PM  Patient's labs are significant for elevated transaminases.  Patient's Tylenol level here is negative.  She denies any use of Tylenol products at this time.  No history of hepatitis.  Has not had any severe diarrhea but did have some abdominal cramping.  Will order hepatitis screening panel as well as abdominal CT and other labs  3:29 PM Case discussed with GI on-call who recommends medicine admission for further evaluation of patient's transaminitis        Final Clinical Impression(s) / ED Diagnoses Final diagnoses:  None    Rx / DC Orders ED Discharge Orders     None         Lorre Nick, MD 01/17/23 1529

## 2023-01-17 NOTE — ED Provider Notes (Signed)
Care of patient received from prior provider at 11:30 PM, please see their note for complete H/P and care plan.  Received handoff per ED course.  Clinical Course as of 01/17/23 2330  Sat Jan 17, 2023  1533 Stable HO from AA Self harm activity and brought in for Psych Screening aggressive transaminitis.  CT Pending:  [place]rahmb[place]t aware. Recommended CT and admit.    [CC]  2003 I was informed by the nurse at this time that the patient had lacerations and need to be repaired.  Lacerations repaired.  I reinterviewed the patient she is now endorsed a massive Tylenol overdose on Wednesday. [CC]  2021 Consulting Poison control now [CC]    Clinical Course User Index [CC] Glyn Ade, MD    Reassessment: Poison Control has recommended NAC. Consulted medicine for admission for ongoing care and management. Lacs Repaired. Reassessed frequently  .Critical Care  Performed by: Glyn Ade, MD Authorized by: Glyn Ade, MD   Critical care provider statement:    Critical care time (minutes):  45   Critical care was necessary to treat or prevent imminent or life-threatening deterioration of the following conditions:  Hepatic failure and toxidrome   Critical care was time spent personally by me on the following activities:  Development of treatment plan with patient or surrogate, discussions with consultants, evaluation of patient's response to treatment, examination of patient, ordering and review of laboratory studies, ordering and review of radiographic studies, ordering and performing treatments and interventions, pulse oximetry, re-evaluation of patient's condition and review of old charts   Care discussed with: admitting provider   .Marland KitchenLaceration Repair  Date/Time: 01/17/2023 11:29 PM  Performed by: Glyn Ade, MD Authorized by: Glyn Ade, MD   Consent:    Consent obtained:  Verbal   Consent given by:  Parent   Risks, benefits, and alternatives were discussed:  yes   Laceration details:    Length (cm):  4 Treatment:    Amount of cleaning:  Standard Skin repair:    Repair method:  Steri-Strips Approximation:    Approximation:  Loose Repair type:    Repair type:  Simple Post-procedure details:    Procedure completion:  Ellender Hose, MD 01/17/23 2330

## 2023-01-17 NOTE — Progress Notes (Addendum)
ACETADOTE CONSULT NOTE - INITIAL   Pharmacy Consult for Acetadote Indication: APAP overdose  Allergies  Allergen Reactions   Penicillins Other (See Comments)    Patient Measurements: Weight: 82 kg (180 lb 12.4 oz)  Vital Signs: Temp: 98.2 F (36.8 C) (09/21 2050) Temp Source: Oral (09/21 2050) BP: 139/89 (09/21 1800) Pulse Rate: 84 (09/21 2050) Intake/Output from previous day: No intake/output data recorded. Intake/Output from this shift: No intake/output data recorded.  Labs: Recent Labs    01/17/23 1122 01/17/23 1227  WBC 6.9  --   HGB 16.4*  --   HCT 49.7*  --   PLT 185  --   APTT  --  24  CREATININE 0.87  --   MG 2.1  --   ALBUMIN 4.1  --   PROT 8.1  --   AST 1,204*  --   ALT 1,694*  --   ALKPHOS 70  --   BILITOT 1.6*  --    CrCl cannot be calculated (Unknown ideal weight.).  Medical History: History reviewed. No pertinent past medical history.  Assessment: 47 YO female who overdosed on Tylenol on Wednesday, 9/18 (unsure of the exact amount). Since then, patient has had persistent nausea/vomiting with no improvement. Initial APAP level <10 on 9/21 @1122 . AST/ALT elevated at 1204/1694. INR stable at 1.1.   Poison Control contacted by pharmacy on 9/21 @2030  -- they recommend proceeding with Acetadote therapy given the elevated LFTs. Per Poison Control, will recheck APAP level, LFTs, and INR 22 hours into Acetadote infusion.   Plan:  - Give IV Acetadote 150mg /kg x1 now, followed by IV Acetadote 15mg /kg/hr  - Recheck APAP level, LFTs, and INR 9/22 @2000    Cherylin Mylar, PharmD Clinical Pharmacist  9/21/20249:03 PM

## 2023-01-17 NOTE — H&P (Signed)
History and Physical      Carolyn Salas:[id number] DOB: 25-Sep-1975 DOA: 01/17/2023; DOS: 01/17/2023  PCP: Deatra James, MD  Patient coming from: home   I have personally briefly reviewed patient's old medical records in [facility]  Chief Complaint: suicidal ideations  HPI: Carolyn Salas is a 47 y.o. female with no reported significant past medical history, who is admitted to [facility] on 01/17/2023 with intentional acetaminophen overdose after presenting from home to [facility] ED complaining of suicidal ideations.   The patient was brought to [facility] emergency department today by her husband for evaluation of suicidal ideations, with the patient's has been noting the patient to be superficially cutting her anterior chest with a knife, will verbally conveying the intent to harm herself.   The patient subsequently also acknowledged that, on Wednesday, 01/14/2023, she intentionally took proximately 50 tabs of regular strength Tylenol in attempt to harm herself.  Subsequently, she has taken no additional Tylenol, nor she taking any other medications in the last few days with the intent to harm herself.  Denies any recent use of alcohol or recreational drugs.  She notes that she experienced some nausea/vomiting shortly after taking the acetaminophen on Wednesday, 01/14/2023, but denies any ensuing nausea/vomiting over the last 48 hours.  She is currently asymptomatic, including no abdominal discomfort.  Denies any recent consumption of wild mushrooms.   No known history of underlying liver pathology.  Denies any recent subjective fever, chills, rigors, or generalized myalgias.  No recent diarrhea, melena, hematochezia.  Per chart review, no prior liver enzyme results available.   ED Course:  Vital signs in the ED were notable for the following: Afebrile; heart rates in the 70s to 90s; systolic blood pressures in the 130s to 150s; respiratory rate 17-18, oxygen saturation 99 to 100%  on room air.  Labs were notable for the following: CMP was notable for the following: Potassium 2.9, creatinine 0.87, alkaline phosphatase 70, total bilirubin 1.6, AST 1204, ALT 1694.  Ammonia 27.  Lipase 26.  Serum magnesium level 2.1.  Acute viral hepatitis panel negative.  Urinary drug screen pan negative.  Serum ethanol level less than 10.  Serum acetaminophen level less than 10.  Salicylate level less than 7.  INR 1.1.  PTT 24.  CBC notable for will with cell count 6900.  Per my interpretation, EKG in ED demonstrated the following: No EKG performed today.  EDP discussed patient's case with on-call gastroenterology, Dr. Doretha Imus, who recommended pursuit of CT abdomen/pelvis to rule out any evidence of contributory gallstones.   Imaging in the ED, per corresponding formal radiology read, was notable for the following: CT abdomen/pelvis with contrast showed no evidence of acute hepatic pathology, no evidence of gallstones, collateral wall thickening, pericholecystic fluid, or biliary dilation, also showed no evidence of acute pancreatic changes.  Poison control was consulted in the ED, with ensuing PC recommendations to run NAC for 12 hours followed by repeat liver enzymes at that time, with poison control conveying that NAC could then be discontinued at the 12 hour mark if liver enzymes are trending down.   While in the ED, the following were administered: NAC was initiated; lactated Ringer's times to the bolus followed by continuous lactated Ringer's running at 125 cc/h.  Potassium chloride 10 mill equivalents.  Subsequently, the patient was admitted for further evaluation management of intentional acetaminophen overdose use in the setting of suicidal ideations, complicated by acute transaminitis with additional labs notable for hypokalemia.  Review of Systems: As per HPI otherwise 10 point review of systems negative.   History reviewed. No pertinent past medical history.  History  reviewed. No pertinent surgical history.  Social History:  reports that she has never smoked. She has never used smokeless tobacco. She reports that she does not currently use alcohol. She reports that she does not use drugs.   Allergies  Allergen Reactions   Penicillins Other (See Comments)    History reviewed. No pertinent family history.  Prior to Admission medications   Medication Sig Start Date End Date Taking? Authorizing Provider  acetaminophen (TYLENOL) 500 MG tablet Take 500 mg by mouth every 6 (six) hours as needed for headache.   Yes [provider]     Objective    Physical Exam: Vitals:   01/17/23 2050 01/17/23 2100 01/17/23 2102 01/17/23 2107  BP:  (!) 152/98  (!) 152/98  Pulse: 84 78  78  Resp:    18  Temp: 98.2 F (36.8 C)   98.2 F (36.8 C)  TempSrc: Oral   Oral  SpO2: 99% 100%  100%  Weight:   82 kg     General: appears to be stated age; alert, oriented Skin: warm, dry, no rash Head:  AT/[place] Mouth:  Oral mucosa membranes appear moist, normal dentition Neck: supple; trachea midline Heart:  RRR; did not appreciate any M/R/G Lungs: CTAB, did not appreciate any wheezes, rales, or rhonchi Abdomen: + BS; soft, ND, NT Vascular: 2+ pedal pulses b/l; 2+ radial pulses b/l Extremities: no peripheral edema, no muscle wasting Neuro: strength and sensation intact in upper and lower extremities b/l    Labs on Admission: I have personally reviewed following labs and imaging studies  CBC: Recent Labs  Lab 01/17/23 1122  WBC 6.9  NEUTROABS 5.5  HGB 16.4*  HCT 49.7*  MCV 92.7  PLT 185   Basic Metabolic Panel: Recent Labs  Lab 01/17/23 1122  NA 138  K 2.9*  CL 104  CO2 21*  GLUCOSE 200*  BUN 12  CREATININE 0.87  CALCIUM 8.9  MG 2.1   GFR: CrCl cannot be calculated (Unknown ideal weight.). Liver Function Tests: Recent Labs  Lab 01/17/23 1122  AST 1,204*  ALT 1,694*  ALKPHOS 70  BILITOT 1.6*  PROT 8.1  ALBUMIN 4.1   Recent  Labs  Lab 01/17/23 1227  LIPASE 26   Recent Labs  Lab 01/17/23 1226  AMMONIA 27   Coagulation Profile: Recent Labs  Lab 01/17/23 1227  INR 1.1   Cardiac Enzymes: No results for input(s): "CKTOTAL", "CKMB", "CKMBINDEX", "TROPONINI" in the last 168 hours. BNP (last 3 results) No results for input(s): "PROBNP" in the last 8760 hours. HbA1C: No results for input(s): "HGBA1C" in the last 72 hours. CBG: No results for input(s): "GLUCAP" in the last 168 hours. Lipid Profile: No results for input(s): "CHOL", "HDL", "LDLCALC", "TRIG", "CHOLHDL", "LDLDIRECT" in the last 72 hours. Thyroid Function Tests: No results for input(s): "TSH", "T4TOTAL", "FREET4", "T3FREE", "THYROIDAB" in the last 72 hours. Anemia Panel: No results for input(s): "VITAMINB12", "FOLATE", "FERRITIN", "TIBC", "IRON", "RETICCTPCT" in the last 72 hours. Urine analysis: No results found for: "COLORURINE", "APPEARANCEUR", "LABSPEC", "PHURINE", "GLUCOSEU", "HGBUR", "BILIRUBINUR", "KETONESUR", "PROTEINUR", "UROBILINOGEN", "NITRITE", "LEUKOCYTESUR"  Radiological Exams on Admission: CT ABDOMEN PELVIS W CONTRAST  Result Date: 01/17/2023 CLINICAL DATA:  Abdominal pain, acute, nonlocalized EXAM: CT ABDOMEN AND PELVIS WITH CONTRAST TECHNIQUE: Multidetector CT imaging of the abdomen and pelvis was performed using the standard protocol following bolus administration  of intravenous contrast. RADIATION DOSE REDUCTION: This exam was performed according to the departmental dose-optimization program which includes automated exposure control, adjustment of the mA and/or kV according to patient size and/or use of iterative reconstruction technique. CONTRAST:  OMNIPAQUE IOHEXOL 300 MG/ML  SOLN COMPARISON:  CT AP 10/15/2013 and 09/29/2003 FINDINGS: Lower chest: Trace LEFT basilar dependent interstitial thickening. Hepatobiliary: Normal volume liver. 2.0 x 1.0 cm RIGHT superior hepatic lobe hypodensity, similar appearance to 2015  comparison and likely consistent with a small cyst versus hemangioma. No gallstones, gallbladder wall thickening, or biliary dilatation. Pancreas: No pancreatic ductal dilatation or surrounding inflammatory changes. Spleen: Normal in size without focal abnormality. Adrenals/Urinary Tract: Adrenal glands are unremarkable. Kidneys are normal, without renal calculi, focal lesion, or hydronephrosis. Bladder is unremarkable. Stomach/Bowel: Stomach is within normal limits. Appendix appears normal. No evidence of bowel wall thickening, distention, or inflammatory changes. Vascular/Lymphatic: No acute vascular findings are present. Prominence of the LEFT ovarian veins with a mildly distended LEFT gonadal vein, measuring 0.7 cm in width. No enlarged abdominal or pelvic lymph nodes. Reproductive: Uterus and adnexa are unremarkable. Other: Small fat-containing umbilical hernia. No abdominopelvic ascites. Musculoskeletal: No acute or significant osseous findings. IMPRESSION: 1. No acute abdominopelvic process. 2. Prominence of the LEFT ovarian veins, with a mildly distended LEFT gonadal vein In the presence of chronic pelvic pain, pelvic congestion can appear similar. Electronically Signed   By: Roanna Banning M.D.   On: 01/17/2023 16:37      Assessment/Plan    Principal Problem:   Tylenol overdose, intentional self-harm, initial encounter [facility]) Active Problems:   Suicidal ideations   Transaminitis   Hypokalemia     #) Intentional acetaminophen overdose: Patient intentionally ingested approximately 50 tabs of regular strength Tylenol on Wednesday, 01/14/2023 with the intent to harm herself.  Presenting serum acetaminophen level less than 10 appears consistent with this timeframe, We will acute transaminitis is also noted.  Poison control was consulted in the ED, with ensuing PC recommendations to run NAC for 12 hours followed by repeat liver enzymes at that time, with poison control conveying that NAC could then  be discontinued at the 12 hour mark if liver enzymes are trending down. NAC started around 2100 in 9/21. CMP to be checked at 9 AM on 9/22, accordingly.   Plan: Poison control consulted.  NAC x 12 hours, with repeat CMP at 9 AM on 01/18/2023, with plan, per poison control, to discontinue NAC at that time if corresponding liver enzymes are trending down.  Repeat INR in the morning.  Continues lactated Ringer's.  Further evaluation management of suicidal ideations, as further detailed below.               #) Acute transaminitis: Presenting elevation in AST/ALT, without corresponding evidence of cholestatic laboratory pattern, which appears consistent with the hepatotoxic implications of her recent intentional acetaminophen overdose.  CT Abdo/pelvis showed no evidence of acute hepatic pathology nor any evidence of acute gallbladder pathology nor any evidence of biliary obstruction.  Synthetic hepatic function appears preserved at this time with INR within normal limits.  Aside from her intentional acetaminophen overdose, no additional contributing factors identified at this time, including acute viral hepatitis panel that was negative, urinary drug screen negative, and salicylate level less than 7.  Does not appear to be associate with any alcohol abuse, and serum ethanol level is less than 10.   Plan: Further evaluation management of presenting intentional acetaminophen overdose, including administration of NAC, per poison control  recommendations, as further detailed above.  CMP in the morning, at which time we will also repeat INR.  Check LDH in the morning.              #) Suicidal ideations: The patient presents with intentional acetaminophen overdose, confessing associated intent to harm herself.  Does not appear to have taken any additional medications to harm herself, and urinary drug screen is found to be pan negative.  Suicide precautions are in place, including one-to-one  monitoring.  Once medically cleared from the standpoint of her intentional acetaminophen overdose, will consult psychiatry for behavioral health evaluation.  Plan: Continue suicide precautions with one-to-one monitoring.  Ensuing psych consult following medical clearance relating to her intentional acetaminophen overdose, as above.                 #) Hypokalemia: presenting potassium level noted to be 2.9.  She received 10 mill equivalents of IV potassium chloride in the ED.  Serum magnesium level noted to be 2.1.  Plan: monitor on tele. KCl 40 meq p.o. x 1 dose now. CMP, mag level in the AM.  Continuous lactated Ringer's, as above.    DVT prophylaxis: SCD's   Code Status: Full code Family Communication: none Disposition Plan: Per Rounding Team Consults called: ED d/w poison control as above; additionally, EDP discussed patient's case with on-call gastroenterology, Dr. Doretha Imus, as further detailed above;  Admission status: Observation    I SPENT GREATER THAN 75  MINUTES IN CLINICAL CARE TIME/MEDICAL DECISION-MAKING IN COMPLETING THIS ADMISSION.     Chaney Born Dejane Scheibe DO [facility] From 7PM - 7AM   01/17/2023, 9:58 PM

## 2023-01-17 NOTE — ED Notes (Signed)
I was advised that this patient's belongings were secured in locker #33 because locker #43 is broken and will not lock

## 2023-01-17 NOTE — ED Notes (Signed)
Pt requesting the LR to be stopped, states it makes her stomach hurt, provider aware.

## 2023-01-17 NOTE — ED Triage Notes (Signed)
Pt presents with c/o suicidal ideation. Pt has superficial cuts to her neck and husband reports some to her wrists as well. Husband reports that pt was attempting to kill herself and has been saying that someone is following her and watching their house. Pt appears very anxious but is cooperative at this time.

## 2023-01-17 NOTE — ED Notes (Signed)
Patient ambulated to the restroom with safety sitter.

## 2023-01-17 NOTE — ED Notes (Signed)
Pt moved from 43 to 16 for medical workup, belongings kept in locker #33 anticipating return to [facility] area once medically cleared

## 2023-01-17 NOTE — ED Notes (Signed)
All personal belongings include phone, keys, and wallet have been give to pts husband to bring home per pts request.

## 2023-01-18 DIAGNOSIS — F320 Major depressive disorder, single episode, mild: Secondary | ICD-10-CM | POA: Diagnosis not present

## 2023-01-18 DIAGNOSIS — T391X1A Poisoning by 4-Aminophenol derivatives, accidental (unintentional), initial encounter: Secondary | ICD-10-CM | POA: Diagnosis present

## 2023-01-18 DIAGNOSIS — F322 Major depressive disorder, single episode, severe without psychotic features: Secondary | ICD-10-CM | POA: Diagnosis present

## 2023-01-18 DIAGNOSIS — T391X2A Poisoning by 4-Aminophenol derivatives, intentional self-harm, initial encounter: Secondary | ICD-10-CM | POA: Diagnosis present

## 2023-01-18 DIAGNOSIS — E663 Overweight: Secondary | ICD-10-CM | POA: Diagnosis present

## 2023-01-18 DIAGNOSIS — F259 Schizoaffective disorder, unspecified: Secondary | ICD-10-CM | POA: Diagnosis present

## 2023-01-18 DIAGNOSIS — E876 Hypokalemia: Secondary | ICD-10-CM | POA: Diagnosis present

## 2023-01-18 DIAGNOSIS — F333 Major depressive disorder, recurrent, severe with psychotic symptoms: Secondary | ICD-10-CM | POA: Diagnosis not present

## 2023-01-18 DIAGNOSIS — Z9151 Personal history of suicidal behavior: Secondary | ICD-10-CM | POA: Diagnosis not present

## 2023-01-18 DIAGNOSIS — F22 Delusional disorders: Secondary | ICD-10-CM | POA: Diagnosis not present

## 2023-01-18 DIAGNOSIS — R7401 Elevation of levels of liver transaminase levels: Secondary | ICD-10-CM | POA: Diagnosis present

## 2023-01-18 DIAGNOSIS — R45851 Suicidal ideations: Secondary | ICD-10-CM | POA: Diagnosis not present

## 2023-01-18 DIAGNOSIS — W260XXA Contact with knife, initial encounter: Secondary | ICD-10-CM | POA: Diagnosis present

## 2023-01-18 DIAGNOSIS — Z6828 Body mass index (BMI) 28.0-28.9, adult: Secondary | ICD-10-CM | POA: Diagnosis not present

## 2023-01-18 LAB — HEPATIC FUNCTION PANEL
ALT: 770 U/L — ABNORMAL HIGH (ref 0–44)
AST: 168 U/L — ABNORMAL HIGH (ref 15–41)
Albumin: 3.5 g/dL (ref 3.5–5.0)
Alkaline Phosphatase: 49 U/L (ref 38–126)
Bilirubin, Direct: 0.3 mg/dL — ABNORMAL HIGH (ref 0.0–0.2)
Indirect Bilirubin: 1.3 mg/dL — ABNORMAL HIGH (ref 0.3–0.9)
Total Bilirubin: 1.6 mg/dL — ABNORMAL HIGH (ref 0.3–1.2)
Total Protein: 6.9 g/dL (ref 6.5–8.1)

## 2023-01-18 LAB — COMPREHENSIVE METABOLIC PANEL
ALT: 922 U/L — ABNORMAL HIGH (ref 0–44)
AST: 253 U/L — ABNORMAL HIGH (ref 15–41)
Albumin: 3.4 g/dL — ABNORMAL LOW (ref 3.5–5.0)
Alkaline Phosphatase: 50 U/L (ref 38–126)
Anion gap: 11 (ref 5–15)
BUN: 5 mg/dL — ABNORMAL LOW (ref 6–20)
CO2: 25 mmol/L (ref 22–32)
Calcium: 8.7 mg/dL — ABNORMAL LOW (ref 8.9–10.3)
Chloride: 103 mmol/L (ref 98–111)
Creatinine, Ser: 0.34 mg/dL — ABNORMAL LOW (ref 0.44–1.00)
GFR, Estimated: >60 mL/min (ref >60)
Glucose, Bld: 115 mg/dL — ABNORMAL HIGH (ref 70–99)
Potassium: 2.9 mmol/L — ABNORMAL LOW (ref 3.5–5.1)
Sodium: 139 mmol/L (ref 135–145)
Total Bilirubin: 1.6 mg/dL — ABNORMAL HIGH (ref 0.3–1.2)
Total Protein: 6.9 g/dL (ref 6.5–8.1)

## 2023-01-18 LAB — CBC WITH DIFFERENTIAL/PLATELET
Abs Immature Granulocytes: 0.06 10*3/uL (ref 0.00–0.07)
Basophils Absolute: 0 10*3/uL (ref 0.0–0.1)
Basophils Relative: 0 %
Eosinophils Absolute: 0 10*3/uL (ref 0.0–0.5)
Eosinophils Relative: 0 %
HCT: 40.8 % (ref 36.0–46.0)
Hemoglobin: 13.8 g/dL (ref 12.0–15.0)
Immature Granulocytes: 1 %
Lymphocytes Relative: 21 %
Lymphs Abs: 1.3 10*3/uL (ref 0.7–4.0)
MCH: 31.1 pg (ref 26.0–34.0)
MCHC: 33.8 g/dL (ref 30.0–36.0)
MCV: 91.9 fL (ref 80.0–100.0)
Monocytes Absolute: 0.5 10*3/uL (ref 0.1–1.0)
Monocytes Relative: 8 %
Neutro Abs: 4.4 10*3/uL (ref 1.7–7.7)
Neutrophils Relative %: 70 %
Platelets: 150 10*3/uL (ref 150–400)
RBC: 4.44 MIL/uL (ref 3.87–5.11)
RDW: 11.9 % (ref 11.5–15.5)
WBC: 6.3 10*3/uL (ref 4.0–10.5)
nRBC: 0 % (ref 0.0–0.2)

## 2023-01-18 LAB — URINALYSIS, ROUTINE W REFLEX MICROSCOPIC
Bilirubin Urine: NEGATIVE
Glucose, UA: NEGATIVE mg/dL
Ketones, ur: 20 mg/dL — AB
Leukocytes,Ua: NEGATIVE
Nitrite: NEGATIVE
Protein, ur: NEGATIVE mg/dL
Specific Gravity, Urine: 1.008 (ref 1.005–1.030)
pH: 6 (ref 5.0–8.0)

## 2023-01-18 LAB — PROTIME-INR
INR: 1.2 (ref 0.8–1.2)
INR: 1.2 (ref 0.8–1.2)
Prothrombin Time: 15.2 seconds (ref 11.4–15.2)
Prothrombin Time: 15.4 seconds — ABNORMAL HIGH (ref 11.4–15.2)

## 2023-01-18 LAB — ACETAMINOPHEN LEVEL: Acetaminophen (Tylenol), Serum: <10 ug/mL — ABNORMAL LOW (ref 10–30)

## 2023-01-18 LAB — TSH: TSH: 0.954 u[IU]/mL (ref 0.350–4.500)

## 2023-01-18 LAB — MAGNESIUM: Magnesium: 1.9 mg/dL (ref 1.7–2.4)

## 2023-01-18 LAB — LACTATE DEHYDROGENASE: LDH: 309 U/L — ABNORMAL HIGH (ref 98–192)

## 2023-01-18 MED ORDER — POTASSIUM CHLORIDE CRYS ER 20 MEQ PO TBCR
40.0000 meq | EXTENDED_RELEASE_TABLET | ORAL | Status: AC
  Administered 2023-01-18 (×2): 40 meq via ORAL
  Filled 2023-01-18 (×2): qty 2

## 2023-01-18 NOTE — Progress Notes (Signed)
Received a call from Poison Control [place]) and updated her regarding Acetaminophen liver, AST, ALT, and INR. According to her, it is okay to dc Acetylcysteine and will continue to monitor the pt.

## 2023-01-18 NOTE — Progress Notes (Signed)
PROGRESS NOTE    Carolyn Salas  YQM:[id number] DOB: February 27, 1976 DOA: 01/17/2023 PCP: Deatra James, MD    Brief Narrative:  Carolyn Salas is a 47 y.o. female with no reported significant past medical history, who is admitted to [facility] on 01/17/2023 with intentional acetaminophen overdose after presenting from home to [facility] ED complaining of suicidal ideations.    The patient was brought to [facility] emergency department today by her husband for evaluation of suicidal ideations, with the patient's has been noting the patient to be superficially cutting her anterior chest with a knife, will verbally conveying the intent to harm herself.    The patient subsequently also acknowledged that, on Wednesday, 01/14/2023, she intentionally took proximately 50 tabs of regular strength Tylenol in attempt to harm herself.  Subsequently, she has taken no additional Tylenol, nor she taking any other medications in the last few days with the intent to harm herself.  Denies any recent use of alcohol or recreational drugs.  She notes that she experienced some nausea/vomiting shortly after taking the acetaminophen on Wednesday, 01/14/2023, but denies any ensuing nausea/vomiting over the last 48 hours.  She is currently asymptomatic, including no abdominal discomfort.  Denies any recent consumption of wild mushrooms.    No known history of underlying liver pathology.  Denies any recent subjective fever, chills, rigors, or generalized myalgias.  No recent diarrhea, melena, hematochezia.   Per chart review, no prior liver enzyme results available.   Assessment and Plan: Intentional acetaminophen overdose: Patient intentionally ingested approximately 50 tabs of regular strength Tylenol on Wednesday, 01/14/2023 with the intent to harm herself.  Presenting serum acetaminophen level less than 10 appears consistent with this timeframe, Poison control was consulted in the ED, with ensuing PC recommendations to run  NAC for 24 (NOT 12)  hours followed by repeat liver enzymes/tylenol and INR at 9PM,    Acute transaminitis: -trending down -labs this PM       Suicidal ideations: The patient presents with intentional acetaminophen overdose, confessing associated intent to harm herself.  Does not appear to have taken any additional medications to harm herself, and urinary drug screen is found to be pan negative.   -psych consult      Hypokalemia:  -replete again   DVT prophylaxis: SCDs Start: 01/17/23 2137    Code Status: Full Code   Disposition Plan:  Level of care: Med-Surg Status is: Observation The patient will require care spanning > 2 midnights and should be moved to inpatient     Consultants:  psych   Subjective: No SOB, no CP  Objective: Vitals:   01/18/23 0341 01/18/23 0453 01/18/23 0754 01/18/23 1114  BP: (!) 156/98  (!) 140/86 123/87  Pulse: 84  80 65  Resp: 16  17 17   Temp: 98.1 F (36.7 C)  97.9 F (36.6 C) 98.3 F (36.8 C)  TempSrc: Oral  Oral Oral  SpO2: 100%  100% 100%  Weight:  84.3 kg      Intake/Output Summary (Last 24 hours) at 01/18/2023 1150 Last data filed at 01/18/2023 1101 Gross per 24 hour  Intake 694.01 ml  Output --  Net 694.01 ml   Filed Weights   01/17/23 2102 01/18/23 0453  Weight: 82 kg 84.3 kg    Examination:   General: Appearance:     Overweight female in no acute distress     Lungs:     respirations unlabored  Heart:    Normal heart rate.  MS:   All extremities are intact.    Neurologic:   Awake, alert       Data Reviewed: I have personally reviewed following labs and imaging studies  CBC: Recent Labs  Lab 01/17/23 1122 01/18/23 0844  WBC 6.9 6.3  NEUTROABS 5.5 4.4  HGB 16.4* 13.8  HCT 49.7* 40.8  MCV 92.7 91.9  PLT 185 150   Basic Metabolic Panel: Recent Labs  Lab 01/17/23 1122 01/18/23 0844  NA 138 139  K 2.9* 2.9*  CL 104 103  CO2 21* 25  GLUCOSE 200* 115*  BUN 12 5*  CREATININE 0.87 0.34*   CALCIUM 8.9 8.7*  MG 2.1 1.9   GFR: CrCl cannot be calculated (Unknown ideal weight.). Liver Function Tests: Recent Labs  Lab 01/17/23 1122 01/18/23 0844  AST 1,204* 253*  ALT 1,694* 922*  ALKPHOS 70 50  BILITOT 1.6* 1.6*  PROT 8.1 6.9  ALBUMIN 4.1 3.4*   Recent Labs  Lab 01/17/23 1227  LIPASE 26   Recent Labs  Lab 01/17/23 1226  AMMONIA 27   Coagulation Profile: Recent Labs  Lab 01/17/23 1227 01/18/23 0844  INR 1.1 1.2   Cardiac Enzymes: No results for input(s): "CKTOTAL", "CKMB", "CKMBINDEX", "TROPONINI" in the last 168 hours. BNP (last 3 results) No results for input(s): "PROBNP" in the last 8760 hours. HbA1C: No results for input(s): "HGBA1C" in the last 72 hours. CBG: No results for input(s): "GLUCAP" in the last 168 hours. Lipid Profile: No results for input(s): "CHOL", "HDL", "LDLCALC", "TRIG", "CHOLHDL", "LDLDIRECT" in the last 72 hours. Thyroid Function Tests: No results for input(s): "TSH", "T4TOTAL", "FREET4", "T3FREE", "THYROIDAB" in the last 72 hours. Anemia Panel: No results for input(s): "VITAMINB12", "FOLATE", "FERRITIN", "TIBC", "IRON", "RETICCTPCT" in the last 72 hours. Sepsis Labs: No results for input(s): "PROCALCITON", "LATICACIDVEN" in the last 168 hours.  No results found for this or any previous visit (from the past 240 hour(s)).       Radiology Studies: CT ABDOMEN PELVIS W CONTRAST  Result Date: 01/17/2023 CLINICAL DATA:  Abdominal pain, acute, nonlocalized EXAM: CT ABDOMEN AND PELVIS WITH CONTRAST TECHNIQUE: Multidetector CT imaging of the abdomen and pelvis was performed using the standard protocol following bolus administration of intravenous contrast. RADIATION DOSE REDUCTION: This exam was performed according to the departmental dose-optimization program which includes automated exposure control, adjustment of the mA and/or kV according to patient size and/or use of iterative reconstruction technique. CONTRAST:   OMNIPAQUE IOHEXOL 300 MG/ML  SOLN COMPARISON:  CT AP 10/15/2013 and 09/29/2003 FINDINGS: Lower chest: Trace LEFT basilar dependent interstitial thickening. Hepatobiliary: Normal volume liver. 2.0 x 1.0 cm RIGHT superior hepatic lobe hypodensity, similar appearance to 2015 comparison and likely consistent with a small cyst versus hemangioma. No gallstones, gallbladder wall thickening, or biliary dilatation. Pancreas: No pancreatic ductal dilatation or surrounding inflammatory changes. Spleen: Normal in size without focal abnormality. Adrenals/Urinary Tract: Adrenal glands are unremarkable. Kidneys are normal, without renal calculi, focal lesion, or hydronephrosis. Bladder is unremarkable. Stomach/Bowel: Stomach is within normal limits. Appendix appears normal. No evidence of bowel wall thickening, distention, or inflammatory changes. Vascular/Lymphatic: No acute vascular findings are present. Prominence of the LEFT ovarian veins with a mildly distended LEFT gonadal vein, measuring 0.7 cm in width. No enlarged abdominal or pelvic lymph nodes. Reproductive: Uterus and adnexa are unremarkable. Other: Small fat-containing umbilical hernia. No abdominopelvic ascites. Musculoskeletal: No acute or significant osseous findings. IMPRESSION: 1. No acute abdominopelvic process. 2. Prominence of the LEFT ovarian veins, with a  mildly distended LEFT gonadal vein In the presence of chronic pelvic pain, pelvic congestion can appear similar. Electronically Signed   By: Roanna Banning M.D.   On: 01/17/2023 16:37        Scheduled Meds:  potassium chloride  40 mEq Oral Q4H   Continuous Infusions:  acetylcysteine 15 mg/kg/hr (01/18/23 0421)     LOS: 0 days    Time spent: 45 minutes spent on chart review, discussion with nursing staff, consultants, updating family and interview/physical exam; more than 50% of that time was spent in counseling and/or coordination of care.    Joseph Art, DO [facility] Available via Epic secure chat 7am-7pm After these hours, please refer to coverage provider listed on [email or web address] 01/18/2023, 11:50 AM

## 2023-01-18 NOTE — Plan of Care (Signed)
  Problem: Nutrition: Goal: Adequate nutrition will be maintained Outcome: Progressing   Problem: Pain Managment: Goal: General experience of comfort will improve Outcome: Progressing   Problem: Safety: Goal: Ability to remain free from injury will improve Outcome: Progressing   

## 2023-01-18 NOTE — Plan of Care (Signed)
Problem: Education: Goal: Knowledge of General Education information will improve Description: Including pain rating scale, medication(s)/side effects and non-pharmacologic comfort measures Outcome: Progressing   Problem: Clinical Measurements: Goal: Ability to maintain clinical measurements within normal limits will improve Outcome: Progressing Goal: Will remain free from infection Outcome: Progressing Goal: Diagnostic test results will improve Outcome: Progressing   Problem: Health Behavior/Discharge Planning: Goal: Ability to manage health-related needs will improve Outcome: Not Progressing

## 2023-01-18 NOTE — Consult Note (Signed)
[facility] Face-to-Face Psychiatry Consult   Reason for Consult:'' suicidal/overdose attempt.'' Referring Physician:  Marlin Canary, DO Patient Identification: Carolyn Salas MRN:  [id number] Principal Diagnosis: Tylenol overdose, intentional self-harm, initial encounter [facility]) Diagnosis:  Principal Problem:   Tylenol overdose, intentional self-harm, initial encounter [facility]) Active Problems:   Suicidal ideations   Transaminitis   Hypokalemia   Tylenol overdose   Major depressive disorder, single episode, severe (HCC)   Total Time spent with patient: 1 hour  Subjective:   Carolyn Salas is a 47 y.o. female patient admitted with suicide attempt.  HPI:  47 year old female who denies prior history of mental illness or substance abuse. Patient was admitted after she attempted suicide by intentionally overdosed on 50 tablets of Tylenol and tried to cut her throat.Patient unable to states that reasons why she attempted suicide and only reports that it was due to poor decisions. However, patient reported the she is not happy to be alive. Patient reports severe depression characterized by recurrent suicidal thoughts, excessive sleep, poor appetite, lack of motivation, paranoia and low energy level. She denies drugs, alcohol use,  homicidal thoughts, anxiety and psychosis. Overall, patient is unable to contract for safety. She will benefit from psych inpatient admission for stabilization after she medically stable.  Past Psychiatric History: none reported by patient  Risk to Self:  yes Risk to Others:  denies Prior Inpatient Therapy:  none reported Prior Outpatient Therapy:  none  Past Medical History: History reviewed. No pertinent past medical history. History reviewed. No pertinent surgical history. Family History: History reviewed. No pertinent family history. Family Psychiatric  History:  Social History:  Social History   Substance and Sexual Activity  Alcohol Use Not Currently     Social  History   Substance and Sexual Activity  Drug Use Never    Social History   Socioeconomic History   Marital status: Married    Spouse name: Not on file   Number of children: Not on file   Years of education: Not on file   Highest education level: Not on file  Occupational History   Not on file  Tobacco Use   Smoking status: Never   Smokeless tobacco: Never  Substance and Sexual Activity   Alcohol use: Not Currently   Drug use: Never   Sexual activity: Not on file  Other Topics Concern   Not on file  Social History Narrative   Not on file   Social Determinants of Health   Financial Resource Strain: Not on file  Food Insecurity: Not on file  Transportation Needs: Not on file  Physical Activity: Not on file  Stress: Not on file  Social Connections: Not on file   Additional Social History:    Allergies:   Allergies  Allergen Reactions   Penicillins Other (See Comments)    Labs:  Results for orders placed or performed during the hospital encounter of 01/17/23 (from the past 48 hour(s))  CBC with Differential/Platelet     Status: Abnormal   Collection Time: 01/17/23 11:22 AM  Result Value Ref Range   WBC 6.9 4.0 - 10.5 K/uL   RBC 5.36 (H) 3.87 - 5.11 MIL/uL   Hemoglobin 16.4 (H) 12.0 - 15.0 g/dL   HCT 82.9 (H) 56.2 - 13.0 %   MCV 92.7 80.0 - 100.0 fL   MCH 30.6 26.0 - 34.0 pg   MCHC 33.0 30.0 - 36.0 g/dL   RDW 86.5 78.4 - 69.6 %   Platelets 185 150 - 400  K/uL   nRBC 0.0 0.0 - 0.2 %   Neutrophils Relative % 80 %   Neutro Abs 5.5 1.7 - 7.7 K/uL   Lymphocytes Relative 15 %   Lymphs Abs 1.0 0.7 - 4.0 K/uL   Monocytes Relative 5 %   Monocytes Absolute 0.3 0.1 - 1.0 K/uL   Eosinophils Relative 0 %   Eosinophils Absolute 0.0 0.0 - 0.5 K/uL   Basophils Relative 0 %   Basophils Absolute 0.0 0.0 - 0.1 K/uL   Immature Granulocytes 0 %   Abs Immature Granulocytes 0.01 0.00 - 0.07 K/uL    Comment: Performed at [facility], [address]  [place], [place] [address]  Comprehensive metabolic panel     Status: Abnormal   Collection Time: 01/17/23 11:22 AM  Result Value Ref Range   Sodium 138 135 - 145 mmol/L   Potassium 2.9 (L) 3.5 - 5.1 mmol/L   Chloride 104 98 - 111 mmol/L   CO2 21 (L) 22 - 32 mmol/L   Glucose, Bld 200 (H) 70 - 99 mg/dL    Comment: Glucose reference range applies only to samples taken after fasting for at least 8 hours.   BUN 12 6 - 20 mg/dL   Creatinine, Ser 6.04 0.44 - 1.00 mg/dL   Calcium 8.9 8.9 - 54.0 mg/dL   Total Protein 8.1 6.5 - 8.1 g/dL   Albumin 4.1 3.5 - 5.0 g/dL   AST 9,811 (H) 15 - 41 U/L   ALT 1,694 (H) 0 - 44 U/L   Alkaline Phosphatase 70 38 - 126 U/L   Total Bilirubin 1.6 (H) 0.3 - 1.2 mg/dL   GFR, Estimated >91 >47 mL/min    Comment: (NOTE) Calculated using the CKD-EPI Creatinine Equation (2021)    Anion gap 13 5 - 15    Comment: Performed at [facility], [address], [place], [place] [address]  Ethanol     Status: None   Collection Time: 01/17/23 11:22 AM  Result Value Ref Range   Alcohol, Ethyl (B) <10 <10 mg/dL    Comment: (NOTE) Lowest detectable limit for serum alcohol is 10 mg/dL.  For medical purposes only. Performed at [facility], [address] [place], [place] [address]   Salicylate level     Status: Abnormal   Collection Time: 01/17/23 11:22 AM  Result Value Ref Range   Salicylate Lvl <7.0 (L) 7.0 - 30.0 mg/dL    Comment: Performed at [facility], [address], [place], [place] [address]  Acetaminophen level     Status: Abnormal   Collection Time: 01/17/23 11:22 AM  Result Value Ref Range   Acetaminophen (Tylenol), Serum <10 (L) 10 - 30 ug/mL    Comment: (NOTE) Therapeutic concentrations vary significantly. A range of 10-30 ug/mL  may be an effective concentration for many patients. However, some  are best treated at concentrations outside of this range. Acetaminophen concentrations >150 ug/mL at 4  hours after ingestion  and >50 ug/mL at 12 hours after ingestion are often associated with  toxic reactions.  Performed at [facility], [address], [place], [place] [address]   Magnesium     Status: None   Collection Time: 01/17/23 11:22 AM  Result Value Ref Range   Magnesium 2.1 1.7 - 2.4 mg/dL    Comment: Performed at [facility], [address], [place], [place] [address]  Ammonia     Status: None  Collection Time: 01/17/23 12:26 PM  Result Value Ref Range   Ammonia 27 9 - 35 umol/L    Comment: Performed at [facility], [address], [place], [place] [address]  Hepatitis panel, acute     Status: None   Collection Time: 01/17/23 12:27 PM  Result Value Ref Range   Hepatitis B Surface Ag NON REACTIVE NON REACTIVE   HCV Ab NON REACTIVE NON REACTIVE    Comment: (NOTE) Nonreactive HCV antibody screen is consistent with no HCV infections,  unless recent infection is suspected or other evidence exists to indicate HCV infection.     Hep A IgM NON REACTIVE NON REACTIVE   Hep B C IgM NON REACTIVE NON REACTIVE    Comment: Performed at [facility], [address], [place], [place] [address]  Lipase, blood     Status: None   Collection Time: 01/17/23 12:27 PM  Result Value Ref Range   Lipase 26 11 - 51 U/L    Comment: Performed at [facility], [address], [place], [place] [address]  Protime-INR     Status: None   Collection Time: 01/17/23 12:27 PM  Result Value Ref Range   Prothrombin Time 14.4 11.4 - 15.2 seconds   INR 1.1 0.8 - 1.2    Comment: (NOTE) INR goal varies based on device and disease states. Performed at [facility], [address], [place], [place] [address]   APTT     Status: None   Collection Time: 01/17/23 12:27 PM  Result Value Ref Range   aPTT 24 24 - 36 seconds    Comment: Performed at [facility], [address]  [place], [place] [address]  Rapid urine drug screen (hospital performed)     Status: None   Collection Time: 01/17/23  2:22 PM  Result Value Ref Range   Opiates NONE DETECTED NONE DETECTED   Cocaine NONE DETECTED NONE DETECTED   Benzodiazepines NONE DETECTED NONE DETECTED   Amphetamines NONE DETECTED NONE DETECTED   Tetrahydrocannabinol NONE DETECTED NONE DETECTED   Barbiturates NONE DETECTED NONE DETECTED    Comment: (NOTE) DRUG SCREEN FOR MEDICAL PURPOSES ONLY.  IF CONFIRMATION IS NEEDED FOR ANY PURPOSE, NOTIFY LAB WITHIN 5 DAYS.  LOWEST DETECTABLE LIMITS FOR URINE DRUG SCREEN Drug Class                     Cutoff (ng/mL) Amphetamine and metabolites    1000 Barbiturate and metabolites    200 Benzodiazepine                 200 Opiates and metabolites        300 Cocaine and metabolites        300 THC                            50 Performed at [facility], [address] [place], [place] [address]   Pregnancy, urine     Status: None   Collection Time: 01/17/23  2:22 PM  Result Value Ref Range   Preg Test, Ur NEGATIVE NEGATIVE    Comment:        THE SENSITIVITY OF THIS METHODOLOGY IS >25 mIU/mL. Performed at [facility], [address], [place], [place] [address]   CBC with Differential/Platelet     Status: None   Collection Time: 01/18/23  8:44 AM  Result Value Ref Range   WBC 6.3 4.0 - 10.5 K/uL   RBC 4.44 3.87 - 5.11 MIL/uL   Hemoglobin 13.8 12.0 - 15.0 g/dL   HCT 40.9 81.1 - 91.4 %   MCV 91.9 80.0 - 100.0 fL   MCH 31.1 26.0 - 34.0 pg   MCHC 33.8 30.0 - 36.0 g/dL   RDW 78.2 95.6 - 21.3 %   Platelets 150 150 - 400 K/uL   nRBC 0.0 0.0 - 0.2 %   Neutrophils Relative % 70 %   Neutro Abs 4.4 1.7 - 7.7 K/uL   Lymphocytes Relative 21 %   Lymphs Abs 1.3 0.7 - 4.0 K/uL   Monocytes Relative 8 %   Monocytes Absolute 0.5 0.1 - 1.0 K/uL   Eosinophils Relative 0 %   Eosinophils Absolute 0.0 0.0 - 0.5 K/uL   Basophils Relative 0 %    Basophils Absolute 0.0 0.0 - 0.1 K/uL   Immature Granulocytes 1 %   Abs Immature Granulocytes 0.06 0.00 - 0.07 K/uL    Comment: Performed at [facility], [address], [place], [place] [address]  Comprehensive metabolic panel     Status: Abnormal   Collection Time: 01/18/23  8:44 AM  Result Value Ref Range   Sodium 139 135 - 145 mmol/L   Potassium 2.9 (L) 3.5 - 5.1 mmol/L   Chloride 103 98 - 111 mmol/L   CO2 25 22 - 32 mmol/L   Glucose, Bld 115 (H) 70 - 99 mg/dL    Comment: Glucose reference range applies only to samples taken after fasting for at least 8 hours.   BUN 5 (L) 6 - 20 mg/dL   Creatinine, Ser 8.46 (L) 0.44 - 1.00 mg/dL   Calcium 8.7 (L) 8.9 - 10.3 mg/dL   Total Protein 6.9 6.5 - 8.1 g/dL   Albumin 3.4 (L) 3.5 - 5.0 g/dL   AST 962 (H) 15 - 41 U/L   ALT 922 (H) 0 - 44 U/L   Alkaline Phosphatase 50 38 - 126 U/L   Total Bilirubin 1.6 (H) 0.3 - 1.2 mg/dL   GFR, Estimated >95 >28 mL/min    Comment: (NOTE) Calculated using the CKD-EPI Creatinine Equation (2021)    Anion gap 11 5 - 15    Comment: Performed at [facility], [address] [place], [place] [address]  Magnesium     Status: None   Collection Time: 01/18/23  8:44 AM  Result Value Ref Range   Magnesium 1.9 1.7 - 2.4 mg/dL    Comment: Performed at [facility], [address], [place], [place] [address]  Protime-INR     Status: None   Collection Time: 01/18/23  8:44 AM  Result Value Ref Range   Prothrombin Time 15.2 11.4 - 15.2 seconds   INR 1.2 0.8 - 1.2    Comment: (NOTE) INR goal varies based on device and disease states. Performed at [facility], [address] [place], [place] [address]   Lactate dehydrogenase     Status: Abnormal   Collection Time: 01/18/23  8:44 AM  Result Value Ref Range   LDH 309 (H) 98 - 192 U/L    Comment: Performed at [facility], [address], [place], [place] [address]   Urinalysis, Routine w reflex microscopic -Urine, Clean Catch     Status: Abnormal   Collection Time: 01/18/23 11:15 AM  Result Value Ref Range   Color, Urine STRAW (A)  YELLOW   APPearance HAZY (A) CLEAR   Specific Gravity, Urine 1.008 1.005 - 1.030   pH 6.0 5.0 - 8.0   Glucose, UA NEGATIVE NEGATIVE mg/dL   Hgb urine dipstick MODERATE (A) NEGATIVE   Bilirubin Urine NEGATIVE NEGATIVE   Ketones, ur 20 (A) NEGATIVE mg/dL   Protein, ur NEGATIVE NEGATIVE mg/dL   Nitrite NEGATIVE NEGATIVE   Leukocytes,Ua NEGATIVE NEGATIVE   RBC / HPF 6-10 0 - 5 RBC/hpf   WBC, UA 0-5 0 - 5 WBC/hpf   Bacteria, UA RARE (A) NONE SEEN   Squamous Epithelial / HPF 0-5 0 - 5 /HPF   Mucus PRESENT     Comment: Performed at [facility], [address] [place], [place] [address]    Current Facility-Administered Medications  Medication Dose Route Frequency Provider Last Rate Last Admin   acetylcysteine (ACETADOTE) 18,000 mg in dextrose 5 % 590 mL (30.5085 mg/mL) infusion  15 mg/kg/hr Intravenous Continuous Marlin Canary U, DO 40.3 mL/hr at 01/18/23 0421 15 mg/kg/hr at 01/18/23 0421   melatonin tablet 3 mg  3 mg Oral QHS PRN Howerter, Justin B, DO       ondansetron (ZOFRAN) injection 4 mg  4 mg Intravenous Q6H PRN Howerter, Justin B, DO   4 mg at 01/17/23 2356   potassium chloride SA (KLOR-CON M) CR tablet 40 mEq  40 mEq Oral Q4H Vann, Jessica U, DO   40 mEq at 01/18/23 1513    Musculoskeletal: Strength & Muscle Tone: within normal limits Gait & Station: normal Patient leans: N/A   Psychiatric Specialty Exam:  Presentation  General Appearance:  Appropriate for Environment  Eye Contact: Minimal  Speech: Clear and Coherent; Slow  Speech Volume: Decreased  Handedness: Right   Mood and Affect  Mood: Depressed  Affect: Congruent   Thought Process  Thought Processes: Linear; Goal Directed  Descriptions of Associations:Intact  Orientation:Full (Time, Place and  Person)  Thought Content:Logical  History of Schizophrenia/Schizoaffective disorder:No data recorded Duration of Psychotic Symptoms:No data recorded Hallucinations:Hallucinations: None  Ideas of Reference:None  Suicidal Thoughts:Suicidal Thoughts: Yes, Active SI Active Intent and/or Plan: With Intent; With Plan  Homicidal Thoughts:Homicidal Thoughts: No   Sensorium  Memory: Immediate Good; Recent Good; Remote Good  Judgment: Poor  Insight: Lacking   Executive Functions  Concentration: Fair  Attention Span: Good  Recall: Good  Fund of Knowledge: Good  Language: Good   Psychomotor Activity  Psychomotor Activity: Psychomotor Activity: Decreased   Assets  Assets: Communication Skills   Sleep  Sleep: Sleep: Fair   Physical Exam: Physical Exam Review of Systems  Psychiatric/Behavioral:  Positive for depression and suicidal ideas. Negative for hallucinations, memory loss and substance abuse. The patient is not nervous/anxious and does not have insomnia.    Blood pressure 130/85, pulse 65, temperature 98.6 F (37 C), temperature source Oral, resp. rate 16, weight 84.3 kg, SpO2 100%. Body mass index is 29.11 kg/m.  Treatment Plan Summary: 47 y/o female who denies prior history of mental illness or substance abuse. She was admitted to the hospital after she overdosed on multiple Tylenol and cut her throat. Patient reports severe depression, paranoia and recurrent suicidal thoughts with plan. Essentially, patient is unable to contract for safety at this time. She will benefit from psych inpatient admission for stabilization after she is medically stable.   Plan/Recommendations: -Continue 1:1 sitter for safety -Consider TOC/Social worker consult to facilitate inpatient psychiatric placement  Disposition: Recommend psychiatric Inpatient admission when medically cleared. Supportive therapy provided  about ongoing stressors. Psychiatric consult service  will continue daily f/u with the patient  Thedore Mins, MD 01/18/2023 4:07 PM

## 2023-01-18 NOTE — Consult Note (Signed)
Referring Provider: ED Primary Care Physician:  Deatra James, MD Primary Gastroenterologist:  Deboraha Sprang PCP   Reason for Consultation: Abnormal LFTs  HPI: Carolyn Salas is a 47 y.o. female brought to the emergency department with suicidal ideation and Tylenol overdose.  Looks like patient took 50 tablets of extra strength Tylenol on January 14, 2023.  Initially had some nausea and vomiting but those symptoms subsequently resolved.  Was brought into the ED by husband for further management of suicidal ideation.  Upon initial evaluation yesterday, she was found to have significantly elevated LFTs with AST of 1204 and ALT of 1694.  Normal T. bili and alkaline phosphatase.  Normal acetaminophen level.  Negative alcohol level.  INR.  Normal ammonia.  Normal CBC.  Urine drug screen negative.  CT abdomen with IV contrast showed no acute GI changes.    GI is consulted for further evaluation.  Blood work this morning showed improvement in LFTs with AST of 253, ALT 922.  Normal INR.  Patient seen and examined at bedside.  History reviewed. No pertinent past medical history.  History reviewed. No pertinent surgical history.  Prior to Admission medications   Medication Sig Start Date End Date Taking? Authorizing Provider  acetaminophen (TYLENOL) 500 MG tablet Take 500 mg by mouth every 6 (six) hours as needed for headache.   Yes [provider]    Scheduled Meds:  potassium chloride  40 mEq Oral Q4H   Continuous Infusions:  acetylcysteine 15 mg/kg/hr (01/18/23 0421)   PRN Meds:.melatonin, ondansetron (ZOFRAN) IV  Allergies as of 01/17/2023 - Review Complete 01/17/2023  Allergen Reaction Noted   Penicillins Other (See Comments) 07/02/2021    History reviewed. No pertinent family history.  Social History   Socioeconomic History   Marital status: Married    Spouse name: Not on file   Number of children: Not on file   Years of education: Not on file   Highest education level: Not on  file  Occupational History   Not on file  Tobacco Use   Smoking status: Never   Smokeless tobacco: Never  Substance and Sexual Activity   Alcohol use: Not Currently   Drug use: Never   Sexual activity: Not on file  Other Topics Concern   Not on file  Social History Narrative   Not on file   Social Determinants of Health   Financial Resource Strain: Not on file  Food Insecurity: Not on file  Transportation Needs: Not on file  Physical Activity: Not on file  Stress: Not on file  Social Connections: Not on file  Intimate Partner Violence: Not on file    Review of Systems: All negative except as stated above in HPI.  Physical Exam: Vital signs: Vitals:   01/18/23 0341 01/18/23 0754  BP: (!) 156/98 (!) 140/86  Pulse: 84 80  Resp: 16 17  Temp: 98.1 F (36.7 C) 97.9 F (36.6 C)  SpO2: 100% 100%   Last BM Date : 01/17/23 Physical Exam Constitutional:      General: She is not in acute distress.    Appearance: Normal appearance.  HENT:     Head: Normocephalic and atraumatic.  Eyes:     General: No scleral icterus.    Extraocular Movements: Extraocular movements intact.  Cardiovascular:     Rate and Rhythm: Normal rate and regular rhythm.     Heart sounds: Normal heart sounds.  Pulmonary:     Effort: Pulmonary effort is normal. No respiratory distress.  Breath sounds: Normal breath sounds.  Abdominal:     General: Bowel sounds are normal. There is no distension.     Palpations: Abdomen is soft.     Tenderness: There is no abdominal tenderness. There is no guarding.  Musculoskeletal:        General: No swelling.  Skin:    General: Skin is warm.     Coloration: Skin is not jaundiced.  Neurological:     Mental Status: She is alert and oriented to person, place, and time.  Psychiatric:        Behavior: Behavior normal.      GI:  Lab Results: Recent Labs    01/17/23 1122 01/18/23 0844  WBC 6.9 6.3  HGB 16.4* 13.8  HCT 49.7* 40.8  PLT 185 150    BMET Recent Labs    01/17/23 1122 01/18/23 0844  NA 138 139  K 2.9* 2.9*  CL 104 103  CO2 21* 25  GLUCOSE 200* 115*  BUN 12 5*  CREATININE 0.87 0.34*  CALCIUM 8.9 8.7*   LFT Recent Labs    01/18/23 0844  PROT 6.9  ALBUMIN 3.4*  AST 253*  ALT 922*  ALKPHOS 50  BILITOT 1.6*   PT/INR Recent Labs    01/17/23 1227 01/18/23 0844  LABPROT 14.4 15.2  INR 1.1 1.2     Studies/Results: CT ABDOMEN PELVIS W CONTRAST  Result Date: 01/17/2023 CLINICAL DATA:  Abdominal pain, acute, nonlocalized EXAM: CT ABDOMEN AND PELVIS WITH CONTRAST TECHNIQUE: Multidetector CT imaging of the abdomen and pelvis was performed using the standard protocol following bolus administration of intravenous contrast. RADIATION DOSE REDUCTION: This exam was performed according to the departmental dose-optimization program which includes automated exposure control, adjustment of the mA and/or kV according to patient size and/or use of iterative reconstruction technique. CONTRAST:  OMNIPAQUE IOHEXOL 300 MG/ML  SOLN COMPARISON:  CT AP 10/15/2013 and 09/29/2003 FINDINGS: Lower chest: Trace LEFT basilar dependent interstitial thickening. Hepatobiliary: Normal volume liver. 2.0 x 1.0 cm RIGHT superior hepatic lobe hypodensity, similar appearance to 2015 comparison and likely consistent with a small cyst versus hemangioma. No gallstones, gallbladder wall thickening, or biliary dilatation. Pancreas: No pancreatic ductal dilatation or surrounding inflammatory changes. Spleen: Normal in size without focal abnormality. Adrenals/Urinary Tract: Adrenal glands are unremarkable. Kidneys are normal, without renal calculi, focal lesion, or hydronephrosis. Bladder is unremarkable. Stomach/Bowel: Stomach is within normal limits. Appendix appears normal. No evidence of bowel wall thickening, distention, or inflammatory changes. Vascular/Lymphatic: No acute vascular findings are present. Prominence of the LEFT ovarian veins with a  mildly distended LEFT gonadal vein, measuring 0.7 cm in width. No enlarged abdominal or pelvic lymph nodes. Reproductive: Uterus and adnexa are unremarkable. Other: Small fat-containing umbilical hernia. No abdominopelvic ascites. Musculoskeletal: No acute or significant osseous findings. IMPRESSION: 1. No acute abdominopelvic process. 2. Prominence of the LEFT ovarian veins, with a mildly distended LEFT gonadal vein In the presence of chronic pelvic pain, pelvic congestion can appear similar. Electronically Signed   By: Roanna Banning M.D.   On: 01/17/2023 16:37    Impression/Plan: -Acute Tylenol overdose resulting in elevated transaminases.  No encephalopathy.  Normal INR.  No evidence of acute hepatic failure. -Elevated LFTs.  Likely from Tylenol overdose.  Recommendations ------------------------- -Called and discussed with patient's husband over the phone. -Patient is appropriately being treated with Acetadote infusion. -Follow poison control guidelines.  As mentioned earlier, no signs of acute hepatic failure.  She is alert and oriented x 3.  Normal INR. -Recheck LFTs and INR periodically.  GI will follow.    LOS: 0 days   Kathi Der  MD, FACP 01/18/2023, 10:40 AM  Contact #  [phone]

## 2023-01-19 ENCOUNTER — Inpatient Hospital Stay (HOSPITAL_COMMUNITY): Payer: BC Managed Care – PPO

## 2023-01-19 ENCOUNTER — Inpatient Hospital Stay (HOSPITAL_COMMUNITY)
Admission: AD | Admit: 2023-01-19 | Discharge: 2023-01-31 | DRG: 885 | Disposition: A | Discharge status: Home / Self Care | Payer: BC Managed Care – PPO | Source: Intra-hospital | Attending: Psychiatry | Admitting: Psychiatry

## 2023-01-19 ENCOUNTER — Encounter (HOSPITAL_COMMUNITY): Payer: Self-pay | Admitting: Family

## 2023-01-19 ENCOUNTER — Other Ambulatory Visit: Payer: Self-pay

## 2023-01-19 DIAGNOSIS — R7401 Elevation of levels of liver transaminase levels: Secondary | ICD-10-CM | POA: Diagnosis present

## 2023-01-19 DIAGNOSIS — Z79899 Other long term (current) drug therapy: Secondary | ICD-10-CM

## 2023-01-19 DIAGNOSIS — Z9152 Personal history of nonsuicidal self-harm: Secondary | ICD-10-CM

## 2023-01-19 DIAGNOSIS — F322 Major depressive disorder, single episode, severe without psychotic features: Secondary | ICD-10-CM | POA: Diagnosis not present

## 2023-01-19 DIAGNOSIS — Z9151 Personal history of suicidal behavior: Secondary | ICD-10-CM | POA: Diagnosis not present

## 2023-01-19 DIAGNOSIS — F333 Major depressive disorder, recurrent, severe with psychotic symptoms: Secondary | ICD-10-CM | POA: Diagnosis not present

## 2023-01-19 DIAGNOSIS — F22 Delusional disorders: Secondary | ICD-10-CM | POA: Diagnosis present

## 2023-01-19 DIAGNOSIS — F332 Major depressive disorder, recurrent severe without psychotic features: Secondary | ICD-10-CM | POA: Diagnosis present

## 2023-01-19 DIAGNOSIS — R45851 Suicidal ideations: Secondary | ICD-10-CM | POA: Diagnosis present

## 2023-01-19 DIAGNOSIS — E876 Hypokalemia: Secondary | ICD-10-CM | POA: Diagnosis present

## 2023-01-19 DIAGNOSIS — T391X2A Poisoning by 4-Aminophenol derivatives, intentional self-harm, initial encounter: Secondary | ICD-10-CM | POA: Diagnosis present

## 2023-01-19 DIAGNOSIS — F320 Major depressive disorder, single episode, mild: Secondary | ICD-10-CM | POA: Diagnosis not present

## 2023-01-19 DIAGNOSIS — F259 Schizoaffective disorder, unspecified: Secondary | ICD-10-CM | POA: Diagnosis not present

## 2023-01-19 DIAGNOSIS — G4700 Insomnia, unspecified: Secondary | ICD-10-CM | POA: Diagnosis present

## 2023-01-19 LAB — CBC
HCT: 41 % (ref 36.0–46.0)
Hemoglobin: 13.9 g/dL (ref 12.0–15.0)
MCH: 30.8 pg (ref 26.0–34.0)
MCHC: 33.9 g/dL (ref 30.0–36.0)
MCV: 90.9 fL (ref 80.0–100.0)
Platelets: 147 10*3/uL — ABNORMAL LOW (ref 150–400)
RBC: 4.51 MIL/uL (ref 3.87–5.11)
RDW: 12.3 % (ref 11.5–15.5)
WBC: 4.6 10*3/uL (ref 4.0–10.5)
nRBC: 0 % (ref 0.0–0.2)

## 2023-01-19 LAB — COMPREHENSIVE METABOLIC PANEL
ALT: 697 U/L — ABNORMAL HIGH (ref 0–44)
AST: 124 U/L — ABNORMAL HIGH (ref 15–41)
Albumin: 3.4 g/dL — ABNORMAL LOW (ref 3.5–5.0)
Alkaline Phosphatase: 51 U/L (ref 38–126)
Anion gap: 9 (ref 5–15)
BUN: 7 mg/dL (ref 6–20)
CO2: 25 mmol/L (ref 22–32)
Calcium: 8.8 mg/dL — ABNORMAL LOW (ref 8.9–10.3)
Chloride: 106 mmol/L (ref 98–111)
Creatinine, Ser: 0.6 mg/dL (ref 0.44–1.00)
GFR, Estimated: >60 mL/min (ref >60)
Glucose, Bld: 98 mg/dL (ref 70–99)
Potassium: 3.4 mmol/L — ABNORMAL LOW (ref 3.5–5.1)
Sodium: 140 mmol/L (ref 135–145)
Total Bilirubin: 1.4 mg/dL — ABNORMAL HIGH (ref 0.3–1.2)
Total Protein: 6.9 g/dL (ref 6.5–8.1)

## 2023-01-19 MED ORDER — IBUPROFEN 400 MG PO TABS: 400.0000 mg | ORAL_TABLET | Freq: Four times a day (QID) | ORAL | Status: DC | PRN

## 2023-01-19 MED ORDER — LORAZEPAM 1 MG PO TABS: 2.0000 mg | ORAL_TABLET | Freq: Three times a day (TID) | ORAL | Status: DC | PRN

## 2023-01-19 MED ORDER — HALOPERIDOL LACTATE 5 MG/ML IJ SOLN: 5.0000 mg | Freq: Three times a day (TID) | INTRAMUSCULAR | Status: DC | PRN

## 2023-01-19 MED ORDER — IBUPROFEN 200 MG PO TABS: 400.0000 mg | ORAL_TABLET | Freq: Four times a day (QID) | ORAL | Status: DC | PRN

## 2023-01-19 MED ORDER — ACETAMINOPHEN 325 MG PO TABS: 650.0000 mg | ORAL_TABLET | Freq: Four times a day (QID) | ORAL | Status: DC | PRN

## 2023-01-19 MED ORDER — MAGNESIUM HYDROXIDE 400 MG/5ML PO SUSP: 30.0000 mL | Freq: Every day | ORAL | Status: DC | PRN

## 2023-01-19 MED ORDER — MELATONIN 3 MG PO TABS
3.0000 mg | ORAL_TABLET | Freq: Every evening | ORAL | Status: DC | PRN
  Filled 2023-01-19 (×5): qty 1

## 2023-01-19 MED ORDER — ENSURE ENLIVE PO LIQD
237.0000 mL | Freq: Two times a day (BID) | ORAL | Status: DC
  Administered 2023-01-27 – 2023-01-31 (×2): 237 mL via ORAL
  Filled 2023-01-19 (×27): qty 237

## 2023-01-19 MED ORDER — LORAZEPAM 2 MG/ML IJ SOLN: 2.0000 mg | Freq: Three times a day (TID) | INTRAMUSCULAR | Status: DC | PRN

## 2023-01-19 MED ORDER — DIPHENHYDRAMINE HCL 50 MG/ML IJ SOLN: 50.0000 mg | Freq: Three times a day (TID) | INTRAMUSCULAR | Status: DC | PRN

## 2023-01-19 MED ORDER — POTASSIUM CHLORIDE CRYS ER 20 MEQ PO TBCR
40.0000 meq | EXTENDED_RELEASE_TABLET | Freq: Once | ORAL | Status: AC
  Administered 2023-01-19: 40 meq via ORAL
  Filled 2023-01-19: qty 2

## 2023-01-19 MED ORDER — DIPHENHYDRAMINE HCL 25 MG PO CAPS: 50.0000 mg | ORAL_CAPSULE | Freq: Three times a day (TID) | ORAL | Status: DC | PRN

## 2023-01-19 MED ORDER — HALOPERIDOL 5 MG PO TABS: 5.0000 mg | ORAL_TABLET | Freq: Three times a day (TID) | ORAL | Status: DC | PRN

## 2023-01-19 MED ORDER — ALUM & MAG HYDROXIDE-SIMETH 200-200-20 MG/5ML PO SUSP: 30.0000 mL | ORAL | Status: DC | PRN

## 2023-01-19 NOTE — Discharge Summary (Addendum)
Physician Discharge Summary  Carolyn Salas LOV:[id number] DOB: 15-Oct-1975 DOA: 01/17/2023  PCP: Deatra James, MD  Admit date: 01/17/2023 Discharge date: 01/19/2023  Admitted From: home Discharge disposition: [facility]   Recommendations for Outpatient Follow-Up:   Prior to d/c ensure had tetanus shot in last 10 years 1 week check LFTs   Discharge Diagnosis:   Principal Problem:   Tylenol overdose, intentional self-harm, initial encounter [facility]) Active Problems:   Suicidal ideations   Transaminitis   Hypokalemia   Tylenol overdose   Major depressive disorder, single episode, severe (HCC)    Discharge Condition: Improved.  Diet recommendation:   Regular.  Wound care: monitor wound on neck  Code status: Full.   History of Present Illness:   Carolyn Salas is a 47 y.o. female with no reported significant past medical history, who is admitted to [facility] on 01/17/2023 with intentional acetaminophen overdose after presenting from home to [facility] ED complaining of suicidal ideations.    The patient was brought to [facility] emergency department today by her husband for evaluation of suicidal ideations, with the patient's has been noting the patient to be superficially cutting her anterior chest with a knife, will verbally conveying the intent to harm herself.    The patient subsequently also acknowledged that, on Wednesday, 01/14/2023, she intentionally took proximately 50 tabs of regular strength Tylenol in attempt to harm herself.  Subsequently, she has taken no additional Tylenol, nor she taking any other medications in the last few days with the intent to harm herself.  Denies any recent use of alcohol or recreational drugs.  She notes that she experienced some nausea/vomiting shortly after taking the acetaminophen on Wednesday, 01/14/2023, but denies any ensuing nausea/vomiting over the last 48 hours.  She is currently asymptomatic, including no abdominal discomfort.   Denies any recent consumption of wild mushrooms.    No known history of underlying liver pathology.  Denies any recent subjective fever, chills, rigors, or generalized myalgias.  No recent diarrhea, melena, hematochezia.   Per chart review, no prior liver enzyme results available.   Hospital Course by Problem:   Intentional acetaminophen overdose: Patient intentionally ingested approximately 50 tabs of regular strength Tylenol on Wednesday, 01/14/2023 with the intent to harm herself.  Presenting serum acetaminophen level less than 10 appears consistent with this timeframe -poison control has signed off -off NAC and medically cleared to go to [facility]   Acute transaminitis: -trending down -outpatient follow up to ensure resolution      Suicidal ideations: The patient presents with intentional acetaminophen overdose, confessing associated intent to harm herself.  Does not appear to have taken any additional medications to harm herself, and urinary drug screen is found to be pan negative.   -psych consult - -plan for residential psych     Hypokalemia:  -repleted   U/a and x ray not consistent with infection, no WBC count    Medical Consultants:   psych   Discharge Exam:   Vitals:   01/19/23 0641 01/19/23 1003  BP: 139/86 107/70  Pulse: 87 84  Resp: 16 20  Temp: 99.1 F (37.3 C) 99.3 F (37.4 C)  SpO2: 98% 100%   Vitals:   01/18/23 2053 01/19/23 0639 01/19/23 0641 01/19/23 1003  BP: 126/83  139/86 107/70  Pulse: 75  87 84  Resp: 16  16 20   Temp: 98.7 F (37.1 C)  99.1 F (37.3 C) 99.3 F (37.4 C)  TempSrc:  SpO2: 100%  98% 100%  Weight:  82.7 kg      General exam: Appears calm and comfortable.    The results of significant diagnostics from this hospitalization (including imaging, microbiology, ancillary and laboratory) are listed below for reference.     Procedures and Diagnostic Studies:   CT ABDOMEN PELVIS W CONTRAST  Result Date: 01/17/2023 CLINICAL  DATA:  Abdominal pain, acute, nonlocalized EXAM: CT ABDOMEN AND PELVIS WITH CONTRAST TECHNIQUE: Multidetector CT imaging of the abdomen and pelvis was performed using the standard protocol following bolus administration of intravenous contrast. RADIATION DOSE REDUCTION: This exam was performed according to the departmental dose-optimization program which includes automated exposure control, adjustment of the mA and/or kV according to patient size and/or use of iterative reconstruction technique. CONTRAST:  OMNIPAQUE IOHEXOL 300 MG/ML  SOLN COMPARISON:  CT AP 10/15/2013 and 09/29/2003 FINDINGS: Lower chest: Trace LEFT basilar dependent interstitial thickening. Hepatobiliary: Normal volume liver. 2.0 x 1.0 cm RIGHT superior hepatic lobe hypodensity, similar appearance to 2015 comparison and likely consistent with a small cyst versus hemangioma. No gallstones, gallbladder wall thickening, or biliary dilatation. Pancreas: No pancreatic ductal dilatation or surrounding inflammatory changes. Spleen: Normal in size without focal abnormality. Adrenals/Urinary Tract: Adrenal glands are unremarkable. Kidneys are normal, without renal calculi, focal lesion, or hydronephrosis. Bladder is unremarkable. Stomach/Bowel: Stomach is within normal limits. Appendix appears normal. No evidence of bowel wall thickening, distention, or inflammatory changes. Vascular/Lymphatic: No acute vascular findings are present. Prominence of the LEFT ovarian veins with a mildly distended LEFT gonadal vein, measuring 0.7 cm in width. No enlarged abdominal or pelvic lymph nodes. Reproductive: Uterus and adnexa are unremarkable. Other: Small fat-containing umbilical hernia. No abdominopelvic ascites. Musculoskeletal: No acute or significant osseous findings. IMPRESSION: 1. No acute abdominopelvic process. 2. Prominence of the LEFT ovarian veins, with a mildly distended LEFT gonadal vein In the presence of chronic pelvic pain, pelvic congestion can  appear similar. Electronically Signed   By: Roanna Banning M.D.   On: 01/17/2023 16:37     Labs:   Basic Metabolic Panel: Recent Labs  Lab 01/17/23 1122 01/18/23 0844 01/19/23 0526  NA 138 139 140  K 2.9* 2.9* 3.4*  CL 104 103 106  CO2 21* 25 25  GLUCOSE 200* 115* 98  BUN 12 5* 7  CREATININE 0.87 0.34* 0.60  CALCIUM 8.9 8.7* 8.8*  MG 2.1 1.9  --    GFR CrCl cannot be calculated (Unknown ideal weight.). Liver Function Tests: Recent Labs  Lab 01/17/23 1122 01/18/23 0844 01/18/23 2029 01/19/23 0526  AST 1,204* 253* 168* 124*  ALT 1,694* 922* 770* 697*  ALKPHOS 70 50 49 51  BILITOT 1.6* 1.6* 1.6* 1.4*  PROT 8.1 6.9 6.9 6.9  ALBUMIN 4.1 3.4* 3.5 3.4*   Recent Labs  Lab 01/17/23 1227  LIPASE 26   Recent Labs  Lab 01/17/23 1226  AMMONIA 27   Coagulation profile Recent Labs  Lab 01/17/23 1227 01/18/23 0844 01/18/23 2029  INR 1.1 1.2 1.2    CBC: Recent Labs  Lab 01/17/23 1122 01/18/23 0844 01/19/23 0526  WBC 6.9 6.3 4.6  NEUTROABS 5.5 4.4  --   HGB 16.4* 13.8 13.9  HCT 49.7* 40.8 41.0  MCV 92.7 91.9 90.9  PLT 185 150 147*   Cardiac Enzymes: No results for input(s): "CKTOTAL", "CKMB", "CKMBINDEX", "TROPONINI" in the last 168 hours. BNP: Invalid input(s): "POCBNP" CBG: No results for input(s): "GLUCAP" in the last 168 hours. D-Dimer No results for input(s): "DDIMER" in  the last 72 hours. Hgb A1c No results for input(s): "HGBA1C" in the last 72 hours. Lipid Profile No results for input(s): "CHOL", "HDL", "LDLCALC", "TRIG", "CHOLHDL", "LDLDIRECT" in the last 72 hours. Thyroid function studies Recent Labs    01/18/23 2029  TSH 0.954   Anemia work up No results for input(s): "VITAMINB12", "FOLATE", "FERRITIN", "TIBC", "IRON", "RETICCTPCT" in the last 72 hours. Microbiology No results found for this or any previous visit (from the past 240 hour(s)).   Discharge Instructions:   Discharge Instructions     Diet general   Complete by: As  directed    Discharge wound care:   Complete by: As directed    Cover wound on neck-- change daily   Increase activity slowly   Complete by: As directed       Allergies as of 01/19/2023       Reactions   Penicillins Other (See Comments)        Medication List     STOP taking these medications    acetaminophen 500 MG tablet Commonly known as: TYLENOL               Discharge Care Instructions  (From admission, onward)           Start     Ordered   01/19/23 0000  Discharge wound care:       Comments: Cover wound on neck-- change daily   01/19/23 1256              Time coordinating discharge: 45 min  Signed:  Joseph Art DO  [facility] 01/19/2023, 12:57 PM

## 2023-01-19 NOTE — TOC Transition Note (Signed)
Transition of Care [facility]) - CM/SW Discharge Note   Patient Details  Name: Carolyn Salas MRN: [id number] Date of Birth: 1976/02/24  Transition of Care [facility]) CM/SW Contact:  Otelia Santee, LCSW Phone Number: 01/19/2023, 3:38 PM   Clinical Narrative:    Per Lewis Moccasin, pt has been accepted to [facility]. Bed number is 301-1. Number for report is [phone]. The pt will be transported via [employer].  Voluntary consent form signed and faxed to [facility]. Pt able to transfer to [facility] once medically ready.        Final next level of care: Psychiatric Hospital Barriers to Discharge: No Barriers Identified   Patient Goals and CMS Choice CMS Medicare.gov Compare Post Acute Care list provided to:: Patient Choice offered to / list presented to : Patient  Discharge Placement                  Patient to be transferred to facility by: Safe Management consultant Additional resources added to the After Visit Summary for                  DME Arranged: N/A DME Agency: NA                  Social Determinants of Health (SDOH) Interventions SDOH Screenings   Tobacco Use: Low Risk  (01/17/2023)     Readmission Risk Interventions     No data to display

## 2023-01-19 NOTE — [facility] Group Notes (Signed)
[facility] Group Notes:  (Nursing/MHT/Case Management/Adjunct)  Date:  01/19/2023  Time:  9:45 PM  Type of Therapy:  Group Therapy  Participation Level:  Did Not Attend  Participation Quality:   n/a  Affect:   n/a  Cognitive:   n/a  Insight:  None  Engagement in Group:   n/a  Modes of Intervention:   n/a  Summary of Progress/Problems:  Pt did not attend AA Group Meeting  Courtny Bennison E Che Below 01/19/2023, 9:45 PM

## 2023-01-19 NOTE — Tx Team (Signed)
Initial Treatment Plan 01/19/2023 6:39 PM ETHYL GAIN ZOX:[id number]    PATIENT STRESSORS: Educational concerns     PATIENT STRENGTHS: Average or above average intelligence  Supportive family/friends    PATIENT IDENTIFIED PROBLEMS: "Processing"                     DISCHARGE CRITERIA:  Adequate post-discharge living arrangements Improved stabilization in mood, thinking, and/or behavior  PRELIMINARY DISCHARGE PLAN: Outpatient therapy Return to previous living arrangement  PATIENT/FAMILY INVOLVEMENT: This treatment plan has been presented to and reviewed with the patient, Carolyn Salas.  The patient and family have been given the opportunity to ask questions and make suggestions.  Gardiner Barefoot, RN 01/19/2023, 6:39 PM

## 2023-01-19 NOTE — Progress Notes (Signed)
Report called to Lurena Joiner, RN at [facility].  Safe transport called.  All IVs removed.  Patient transported to [facility] by Safe transport with Manufacturing systems engineer.  Levora Angel, RN

## 2023-01-19 NOTE — Progress Notes (Signed)
Patient ID: Carolyn Salas, female   DOB: 1975-07-15, 47 y.o.   MRN: [id number]  On 01/14/23, patient took 50 regular strength tylenol in a self harm attempt. Patient was admitted to the ED on 01/17/23 after cutting her neck, wrist, and chest in another self harm event. Patient minimal in interaction during admission. Endorses SI with no plan and contracts for safety. Denies HI/AVH and pain. States that her stressor is work, but didn't want to expand on the matter. Reports that she feels "numb and unsure." Patient oriented to unit rules and procedures. Food and drink offered and accepted. Safety checks initiated. Patient remains safe at this time.

## 2023-01-19 NOTE — Progress Notes (Signed)
UNASSIGNED PATIENT Subjective: Patient seen and examined.Carolyn Salas  Her family is at her bedside she is not very communicative.  She denies any active issues at the present time awaiting discharge.  Her PCP is Dr. Maureen Ralphs sun and she has had a colonoscopy with one of the  gastroenterologists at [facility].  Objective: Vital signs in last 24 hours: Temp:  [98.5 F (36.9 C)-99.3 F (37.4 C)] 98.5 F (36.9 C) (09/23 1300) Pulse Rate:  [65-87] 84 (09/23 1003) Resp:  [16-20] 20 (09/23 1003) BP: (107-139)/(70-86) 107/70 (09/23 1003) SpO2:  [98 %-100 %] 100 % (09/23 1003) Weight:  [82.7 kg] 82.7 kg (09/23 0639) Last BM Date : 01/17/23  Intake/Output from previous day: 09/22 0701 - 09/23 0700 In: 898.1 [P.O.:180; I.V.:718.1] Out: -  Intake/Output this shift: No intake/output data recorded.  General appearance: alert, cooperative, appears stated age, and no distress Resp: clear to auscultation bilaterally Cardio: regular rate and rhythm, S1, S2 normal, no murmur, click, rub or gallop GI: soft, non-tender; bowel sounds normal; no masses,  no organomegaly  Lab Results: Recent Labs    01/17/23 1122 01/18/23 0844 01/19/23 0526  WBC 6.9 6.3 4.6  HGB 16.4* 13.8 13.9  HCT 49.7* 40.8 41.0  PLT 185 150 147*   BMET Recent Labs    01/17/23 1122 01/18/23 0844 01/19/23 0526  NA 138 139 140  K 2.9* 2.9* 3.4*  CL 104 103 106  CO2 21* 25 25  GLUCOSE 200* 115* 98  BUN 12 5* 7  CREATININE 0.87 0.34* 0.60  CALCIUM 8.9 8.7* 8.8*   LFT Recent Labs    01/18/23 2029 01/19/23 0526  PROT 6.9 6.9  ALBUMIN 3.5 3.4*  AST 168* 124*  ALT 770* 697*  ALKPHOS 49 51  BILITOT 1.6* 1.4*  BILIDIR 0.3*  --   IBILI 1.3*  --    PT/INR Recent Labs    01/18/23 0844 01/18/23 2029  LABPROT 15.2 15.4*  INR 1.2 1.2   Hepatitis Panel Recent Labs    01/17/23 1227  HEPBSAG NON REACTIVE  HCVAB NON REACTIVE  HEPAIGM NON REACTIVE  HEPBIGM NON REACTIVE   Studies/Results: DG CHEST PORT 1 VIEW  Result  Date: 01/19/2023 CLINICAL DATA:  Atelectasis. EXAM: PORTABLE CHEST 1 VIEW COMPARISON:  None Available. FINDINGS: The heart size and mediastinal contours are within normal limits. Both lungs are clear. The visualized skeletal structures are unremarkable. IMPRESSION: No active disease. Electronically Signed   By: Signa Kell M.D.   On: 01/19/2023 12:39   CT ABDOMEN PELVIS W CONTRAST  Result Date: 01/17/2023 CLINICAL DATA:  Abdominal pain, acute, nonlocalized EXAM: CT ABDOMEN AND PELVIS WITH CONTRAST TECHNIQUE: Multidetector CT imaging of the abdomen and pelvis was performed using the standard protocol following bolus administration of intravenous contrast. RADIATION DOSE REDUCTION: This exam was performed according to the departmental dose-optimization program which includes automated exposure control, adjustment of the mA and/or kV according to patient size and/or use of iterative reconstruction technique. CONTRAST:  OMNIPAQUE IOHEXOL 300 MG/ML  SOLN COMPARISON:  CT AP 10/15/2013 and 09/29/2003 FINDINGS: Lower chest: Trace LEFT basilar dependent interstitial thickening. Hepatobiliary: Normal volume liver. 2.0 x 1.0 cm RIGHT superior hepatic lobe hypodensity, similar appearance to 2015 comparison and likely consistent with a small cyst versus hemangioma. No gallstones, gallbladder wall thickening, or biliary dilatation. Pancreas: No pancreatic ductal dilatation or surrounding inflammatory changes. Spleen: Normal in size without focal abnormality. Adrenals/Urinary Tract: Adrenal glands are unremarkable. Kidneys are normal, without renal calculi, focal lesion, or  hydronephrosis. Bladder is unremarkable. Stomach/Bowel: Stomach is within normal limits. Appendix appears normal. No evidence of bowel wall thickening, distention, or inflammatory changes. Vascular/Lymphatic: No acute vascular findings are present. Prominence of the LEFT ovarian veins with a mildly distended LEFT gonadal vein, measuring 0.7 cm in  width. No enlarged abdominal or pelvic lymph nodes. Reproductive: Uterus and adnexa are unremarkable. Other: Small fat-containing umbilical hernia. No abdominopelvic ascites. Musculoskeletal: No acute or significant osseous findings. IMPRESSION: 1. No acute abdominopelvic process. 2. Prominence of the LEFT ovarian veins, with a mildly distended LEFT gonadal vein In the presence of chronic pelvic pain, pelvic congestion can appear similar. Electronically Signed   By: Roanna Banning M.D.   On: 01/17/2023 16:37    Medications: I have reviewed the patient's current medications. Prior to Admission:  Medications Prior to Admission  Medication Sig Dispense Refill Last Dose   acetaminophen (TYLENOL) 500 MG tablet Take 500 mg by mouth every 6 (six) hours as needed for headache.   01/14/2023   Scheduled: Continuous:  Assessment/Plan: 1) Tylenol overdose with intentional self-harm-transaminases over 1000 with a AST of 04/29/2002 and ALT of 1694 down to 124 and 697 respectively. I advised her husband that she should follow-up with her primary gastroenterologist/hepatologist at [facility] to make sure her LFTs normalize. 2) History of depression and schizoaffective disorder. Patient is being discharged home today she has been advised to follow-up with a psychiatrist and with her PCP for further recommendations.  LOS: 1 day   Carolyn Salas 01/19/2023, 2:16 PM

## 2023-01-20 DIAGNOSIS — F22 Delusional disorders: Secondary | ICD-10-CM | POA: Diagnosis present

## 2023-01-20 MED ORDER — SERTRALINE HCL 25 MG PO TABS
25.0000 mg | ORAL_TABLET | Freq: Every day | ORAL | Status: AC
  Administered 2023-01-20 – 2023-01-21 (×2): 25 mg via ORAL
  Filled 2023-01-20 (×4): qty 1

## 2023-01-20 MED ORDER — ARIPIPRAZOLE 2 MG PO TABS
2.0000 mg | ORAL_TABLET | Freq: Once | ORAL | Status: AC
  Administered 2023-01-20: 2 mg via ORAL
  Filled 2023-01-20 (×2): qty 1

## 2023-01-20 NOTE — Progress Notes (Signed)
Patient agreed to take medications prior to dinner. Patient remains guarded and isolative. Patient still refusing EKG.

## 2023-01-20 NOTE — H&P (Signed)
Psychiatric Admission Assessment Adult  Patient Identification: Carolyn Salas MRN:  [id number] Date of Evaluation:  01/20/2023  Chief Complaint:  MDD (major depressive disorder), recurrent episode, severe (HCC) [F33.2],  MDD (major depressive disorder), recurrent episode, severe (HCC)  Principal Problem:   MDD (major depressive disorder), recurrent episode, severe (HCC)   History of Present Illness:  Carolyn Salas is a 47 y.o., female with no past psychiatric history who presents to the [facility] Voluntary from [facility] Emergency Department for evaluation and management of suicidal thoughts, recent intentional acetaminophen overdose, and paranoia.  HPI: The patient was brought to [facility] from home by her husband on 9/21 after an intentional acetaminophen overdose on 9/18 and for intentionally cutting her neck and arms to inflict self-harm.  This morning pt reports she is in the hospital because she feels paranoid. Since August she feels like she is being watched and listened to, and she feels this is out of her control and being done by some unknown source with an unknown goal. She feels like this is interfering with her life and feels this source is speaking to her through "other [people] always having staged conversations" and sending her messages about her mistakes related to work to make her feel apologetic. She acknowledges she makes mistakes because she is human, and she is not worried about the messages in relation to herself because she is not causing other people's discomfort. States paranoia is unchanged in intensity, frequency since it began in August, and it is not alleviated or worsened by any factors. Because of this huge change in her life with work and feeling watched, she wanted to go to sleep and not wake up. Denies seeing or hearing the entity that is watching/listening to her. 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 She does not know the time course of being fired or if she will be dismissed.   Today pt reports feeling guilty, having decreased appetite by choice, and having some thoughts of wanting to go to sleep and not wake up due to the overwhelm of her whole life changing. Denies suicidal plan and intent. Denies HI, AVH. Pt denies feeling depressed, having problems with sleep, energy, restlessness. Denies symptoms of mania including rapid or fast speech, increased impulsivity or grandiosity, concentration issues.  Denies trauma, abuse. Denies OCD symptoms.  Mode of transport to Hospital: family car via husband Current Outpatient (Home) Medication List: No prescriptions, otc, supplements PRN medication prior to evaluation: None  Chart review: On chart review, prior to this evaluation, patient intentionally took 50 tablets of regular strength Tylenol in a self-harm attempt. She presented to [facility] emergency department by husband from home on 01/17/2023 for intentional acetaminophen overdose, complaint of suicidal ideation. Per chart review husband reported pt also had superficial cuts to neck and wrists. Pt did not take additional tylenol or other medications with intent to harm herself. Pt reported nausea/vomiting after taking acetaminophen, but was asymptomatic upon presentation to the ED. Patient vital signs in ED: afebrile, HR 79-90s, BP 130s-150 systolic, oxygen saturation 100% on room air.   Upon admission: Labs showed significantly elevated LFTS: AST 1204, ALT 1694. Normal T. bili and alkaline phosphatase. Normal acetaminophen level. Negative alcohol level. INR. Normal ammonia. Normal CBC. Urine drug screen negative. CT abdomen with IV contrast showed no acute GI changes.  Poison control consulted: recommended NAC, which was initiated. Patient received LR. CT abdomen/pelvis: no acute abdominal process  Pt admitted for further evaluation of intentional acetaminophen overdose in the setting of  SI complicated by acute transaminitis, hypokalemia. NAC revealed resolving acute transaminitis with liver enzymes trending down.  At [facility], pt reported no history of mental illness or substance abuse. Reported attempted suicide due to poor decisions. Reported severe depression characterized by recurrent suicidal thoughts, excessive sleep, poor appetite, lack of motivation, paranoia and low energy level. She denied drugs, alcohol use, homicidal thoughts, anxiety and psychosis .  Discharge recommendations: check LFTs in one week (9/28) Poison control signed off, recommended to discontinue NAC, cleared to transfer to [facility] when pt stable.   Subjective Sleep past 24 hours: fair Subjective Appetite past 24 hours: "decreased but by choice"  9/24 Collateral information obtained Alphia Moh, patient's husband) Patient granted permission to speak to contact person without restrictions. During this conversation, I obtained further information regarding patient's history.  Patient's husband recounts that patient's symptoms started about halfway through the second of week of school  (approximately 12/31/22), with patient started to become agitated. Told husband she "felt set up for failure" by her boss. She reported her boss how she felt, asked for feedback, and was reassured that she was doing well at work. Patient went up chain of command to next boss and HR because she felt paranoid, frustrated, and not heard. She was again reassured there were no problems at work. According to husband pt felt her bosses were part of a conspiracy to get her out of work. Next, she started talking about feeling followed, people were listening to her conversations, and that technology at school and at home were bugged or modified.   Patient took a leave of absence at work, and in 1.5 weeks her symptoms of paranoia was constant and continued to worsen. She became increasingly worried about her children leaving for school,  became uncharacteristically and increasingly affectionate with the kids and hugged them before they left the house. Because of fear the phone was bugged and because she was receiving increased calls from concerned colleagues, patient changed her phone and phone number. When new phone had issues getting set up, pt attributed tech problems to the organization spying on them. Husband reports patient had decreased sleep, decreased appetite for the past week, decreased energy (pt feeling tired, staying in bed), and increased restlessness (leg shaking often). Denies rapid speech, problems in communicating clearly or speaking illogically, risky behavior, grandiosity, auditory or visual hallucinations.  On 9/18 patient told husband she had a headache and said she took a couple of Tylenol. She was up all night vomiting. Two days later, pt was agitated in the evening and had a conversation with her husband that she felt they would not be watched or listened in on anymore if she resigned from her job. Eventually she went to bed before her husband, who saw her sleeping soundly.  Following morning (on 9/21) patient noticed some blood on comforter. Asked pt if she had a nosebleed, and she covered herself with the sheets. Husband pulled away the sheets to see patient had cut her neck and arms, and he rushed her to the hospital. In the ED her liver enzymes were abnormally high, and after husband asked her multiple times, pt revealed she had shaken the bottle of Tylenol to her mouth to ingest an unknown number of pills. Husband found empty tylenol bottle at home. Husband also noted that they keep a gun next to their bed, and he immediately removed it and locked it in a  secure safe. He is a Chief Financial Officer, and there are multiple guns appropriately secured in the household in a safe that only he has access to. He is currently coordinating with his brother to remove all weapons and ammunition from their house while patient is  in hospital.   At baseline, he reports his wife is sharp, articulate, and well-regarded with her work as an Geophysicist/field seismologist vice principal. She gets along well with her colleagues and was even recognized with a party at work for her achievements during the year. She earned two Master's degrees and graduated top of her class. -Aside from school starting, he denies any triggering events and any previous history of similar episodes or abnormal behavior.  -Denies any PMH of psychiatric and medical conditions, inpatient and outpatient treatment, medications. Denies head injury, seizures.  -Reports pt's family history include mother with an unspecified mental illness where "the roles are reversed: her mother acts like a child and [patient] behaves as the mom" by taking care of whatever pt's mother needs. Potentially her biological father's side has mental disorder, possibly bipolar or schizophrenia but not sure. Denies any family history of suicide.  -Social history: no alcohol in house, pt never drank, smoked, used recreational substances or abused prescriptions. Denies abuse/trauma. States patient had an interesting childhood: She was raised by a couple, and patient believed her father figure was her biological dad. Unfortunately when he was trying to get custody of her, paternity test revealed he was not the father, so patient and sibling were sent to live with grandma.    Past Psychiatric History:  Previous psych diagnoses: Denies Prior inpatient psychiatric treatment: Denies Prior outpatient psychiatric treatment: Denies Current psychiatric provider: Denies  Neuromodulation history: denies  Current therapist: Denies Psychotherapy hx: Denies  History of suicide attempts: Denies History of homicide: Denies  Psychotropic medications: None  Substance Use History: Alcohol: never drinks Tobacco: never smoked, vaped, or chewed tobacco Cannabis (marijuana): never tried Cocaine: never  tried Methamphetamines: never tried Psilocybin (mushrooms): never tried Ecstasy (MDMA / molly): never tried LSD (acid): never tried Opiates (fentanyl / heroin): never tried Benzos (Xanax, Klonopin): never tried IV drug use: Never Prescribed meds abuse: Never  History of detox: Never History of rehab: Never  Is the patient at risk to self? Yes Has the patient been a risk to self in the past 6 months? Yes Has the patient been a risk to self within the distant past? No Is the patient a risk to others? Yes Has the patient been a risk to others in the past 6 months? Yes Has the patient been a risk to others within the distant past? No  Alcohol Screening: 1. How often do you have a drink containing alcohol?: Never 2. How many drinks containing alcohol do you have on a typical day when you are drinking?: n/a 3. How often do you have six or more drinks on one occasion?: Never AUDIT-C Score: 0  Substance Abuse History in the last 12 months: No  Allergies: endorses the following medication allergies with resulting symptoms: Penicillin  Past Medical/Surgical History:  Medical Diagnoses: None Home Rx: None Prior Hosp: None Prior Surgeries / non-head trauma: None  Head trauma: denies LOC: denies Concussions: denies Seizures: denies  Last menstrual period and contraceptives:   Family History:  Medical: Possibly father with diabetes Psych: Mother - "in her own world", disabled Psych Rx: Unknown Suicide: Unknown, denies Homicide: Denies Substance use family hx: Denies  Social History:  Place of birth and grew  up where: [place], [place]; grew up in [place] Abuse: no history of abuse Marital Status: Married to [place] for 15 years. Two children together. Raising godson for 8 years Sexual orientation: straight Children: 2 with current partner. 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 See collateral provided in HPI by husband for further details Pills stockpile: *Need to discuss with husband  Lab Results:  Results for orders placed or performed during the hospital encounter of 01/17/23 (from the past 48 hour(s))  Acetaminophen level     Status: Abnormal   Collection Time: 01/18/23  8:29 PM  Result Value Ref Range   Acetaminophen (Tylenol), Serum <10 (L) 10 - 30 ug/mL    Comment: (NOTE) Therapeutic concentrations vary significantly. A range of 10-30 ug/mL  may be an effective concentration for many patients. However, some  are best treated at concentrations outside of this range. Acetaminophen concentrations >150 ug/mL at 4 hours after ingestion  and >50 ug/mL at 12 hours after ingestion are often associated with  toxic reactions.  Performed at [facility], [address], [place], [place] [address]   Hepatic function panel     Status: Abnormal   Collection Time: 01/18/23  8:29 PM  Result Value Ref Range   Total Protein 6.9 6.5 - 8.1 g/dL   Albumin 3.5 3.5 - 5.0 g/dL   AST 696 (H) 15 - 41 U/L   ALT 770 (H) 0 - 44 U/L   Alkaline Phosphatase 49 38 - 126 U/L   Total Bilirubin 1.6 (H) 0.3 - 1.2 mg/dL   Bilirubin, Direct 0.3 (H) 0.0 - 0.2 mg/dL   Indirect Bilirubin 1.3 (H) 0.3 - 0.9 mg/dL    Comment: Performed at [facility], [address] [place], [place] [address]  Protime-INR     Status: Abnormal   Collection Time: 01/18/23  8:29 PM  Result Value Ref Range   Prothrombin Time 15.4 (H) 11.4 - 15.2 seconds   INR 1.2 0.8 - 1.2    Comment: (NOTE) INR goal varies based on device and disease states. Performed at [facility], [address] [place], [place] [address]   TSH     Status: None   Collection Time: 01/18/23  8:29  PM  Result Value Ref Range   TSH 0.954 0.350 - 4.500 uIU/mL    Comment: Performed by a 3rd Generation assay with a functional sensitivity of <=0.01 uIU/mL. Performed at [facility], [address] [place], [place] [address]   CBC     Status: Abnormal   Collection Time: 01/19/23  5:26 AM  Result Value Ref Range   WBC 4.6 4.0 - 10.5 K/uL   RBC 4.51 3.87 - 5.11 MIL/uL   Hemoglobin 13.9 12.0 - 15.0 g/dL   HCT 72.5 36.6 - 44.0 %   MCV 90.9 80.0 - 100.0 fL   MCH 30.8 26.0 - 34.0 pg   MCHC 33.9 30.0 - 36.0 g/dL   RDW 34.7 42.5 - 95.6 %   Platelets 147 (L) 150 - 400 K/uL   nRBC 0.0 0.0 - 0.2 %    Comment: Performed at [place] [facility], [address] [place], [place] [address]  Comprehensive metabolic panel  Status: Abnormal   Collection Time: 01/19/23  5:26 AM  Result Value Ref Range   Sodium 140 135 - 145 mmol/L   Potassium 3.4 (L) 3.5 - 5.1 mmol/L   Chloride 106 98 - 111 mmol/L   CO2 25 22 - 32 mmol/L   Glucose, Bld 98 70 - 99 mg/dL    Comment: Glucose reference range applies only to samples taken after fasting for at least 8 hours.   BUN 7 6 - 20 mg/dL   Creatinine, Ser 8.41 0.44 - 1.00 mg/dL   Calcium 8.8 (L) 8.9 - 10.3 mg/dL   Total Protein 6.9 6.5 - 8.1 g/dL   Albumin 3.4 (L) 3.5 - 5.0 g/dL   AST 660 (H) 15 - 41 U/L   ALT 697 (H) 0 - 44 U/L   Alkaline Phosphatase 51 38 - 126 U/L   Total Bilirubin 1.4 (H) 0.3 - 1.2 mg/dL   GFR, Estimated >63 >01 mL/min    Comment: (NOTE) Calculated using the CKD-EPI Creatinine Equation (2021)    Anion gap 9 5 - 15    Comment: Performed at [facility], [address] [place], [place] [address]    Blood Alcohol level:  Lab Results  Component Value Date   ETH <10 01/17/2023    Metabolic Disorder Labs:  No results found for: "HGBA1C", "MPG" No results found for: "PROLACTIN" No results found for: "CHOL", "TRIG", "HDL", "CHOLHDL", "VLDL", "LDLCALC"  Current  Medications: Current Facility-Administered Medications  Medication Dose Route Frequency Provider Last Rate Last Admin   acetaminophen (TYLENOL) tablet 650 mg  650 mg Oral Q6H PRN Starkes-Perry, Juel Burrow, FNP       alum & mag hydroxide-simeth (MAALOX/MYLANTA) 200-200-20 MG/5ML suspension 30 mL  30 mL Oral Q4H PRN Starkes-Perry, Juel Burrow, FNP       diphenhydrAMINE (BENADRYL) capsule 50 mg  50 mg Oral TID PRN Starkes-Perry, Juel Burrow, FNP       Or   diphenhydrAMINE (BENADRYL) injection 50 mg  50 mg Intramuscular TID PRN Starkes-Perry, Juel Burrow, FNP       feeding supplement (ENSURE ENLIVE / ENSURE PLUS) liquid 237 mL  237 mL Oral BID BM Attiah, Nadir, MD       haloperidol (HALDOL) tablet 5 mg  5 mg Oral TID PRN Starkes-Perry, Juel Burrow, FNP       Or   haloperidol lactate (HALDOL) injection 5 mg  5 mg Intramuscular TID PRN Starkes-Perry, Juel Burrow, FNP       ibuprofen (ADVIL) tablet 400 mg  400 mg Oral Q6H PRN Starkes-Perry, Juel Burrow, FNP       LORazepam (ATIVAN) tablet 2 mg  2 mg Oral TID PRN Starkes-Perry, Juel Burrow, FNP       Or   LORazepam (ATIVAN) injection 2 mg  2 mg Intramuscular TID PRN Starkes-Perry, Juel Burrow, FNP       magnesium hydroxide (MILK OF MAGNESIA) suspension 30 mL  30 mL Oral Daily PRN Starkes-Perry, Juel Burrow, FNP       melatonin tablet 3 mg  3 mg Oral QHS PRN Starkes-Perry, Juel Burrow, FNP        PTA Medications: No medications prior to admission.    Physical Findings:  Psychiatric Specialty Exam: General Appearance:  Laying down in bed with sheets pulled up to face. Appropriately groomed   Eye Contact:  Minimal,    Speech:  Clear and Coherent; Slow   Volume:  Decreased   Mood:  "I don't really know"  Affect:  Congruent, dysphoric, cautious   Thought Content:  Persecutory delusions, ideas of reference   Suicidal Thoughts: "sometimes I feel like I want to go to sleep and not wake up." No plan, no intent  Homicidal Thoughts: Denies HI  Thought Process:  Logical,  linear, no loose associations   Orientation:  Full (Time, Place and Person)     Memory:  Immediate Good; Recent Good; Remote Good   Judgment:  Poor   Insight:  Lacking   Concentration:  Fair   Recall:  Good   Fund of Knowledge:  Good   Language:  Good   Psychomotor Activity: No data recorded  Assets:  Communication Skills   Sleep: No data recorded    Review of Systems Review of Systems  Constitutional:  Positive for malaise/fatigue.  Gastrointestinal: Negative.   Genitourinary: Negative.   Musculoskeletal: Negative.   Neurological:  Negative for dizziness, weakness and headaches.  Psychiatric/Behavioral:  The patient is not nervous/anxious.     Vital signs: Blood pressure (!) 119/90, pulse 75, temperature 99 F (37.2 C), temperature source Oral, resp. rate 18, height 5\' 7"  (1.702 m), weight 79.9 kg, SpO2 100%. Body mass index is 27.6 kg/m. Physical Exam Vitals and nursing note reviewed.  Constitutional:      General: She is not in acute distress.    Appearance: She is not ill-appearing or toxic-appearing.     Comments: Somnolent  HENT:     Head: Normocephalic.  Pulmonary:     Effort: Pulmonary effort is normal.  Neurological:     Mental Status: She is oriented to person, place, and time and easily aroused.      -- Encouraged patient to participate in unit milieu and in scheduled group therapies   -- Short Term Goals: Ability to identify changes in lifestyle to reduce recurrence of condition will improve, Ability to disclose and discuss suicidal ideas, Ability to identify and develop effective coping behaviors will improve, Ability to maintain clinical measurements within normal limits will improve, and Compliance with prescribed medications will improve  -- Long Term Goals: Improvement in symptoms so as ready for discharge   Assets  Assets:Communication Skills   Treatment Plan Summary: Daily contact with patient to assess and evaluate symptoms  and progress in treatment and medication management  ASSESSMENT:  Diagnoses / Active Problems: Acute paranoia with persecutory delusions Major Depressive Disorder Recent intentional acetaminophen overdose Suicidal ideations with recent suicide attempt by cutting Transaminitis, resolving Hypokalemia, resolving   PLAN: Safety and Monitoring:  -- Voluntary admission to inpatient psychiatric unit for safety, stabilization and treatment  -- Daily contact with patient to assess and evaluate symptoms and progress in treatment  -- Patient's case to be discussed in multi-disciplinary team meeting  -- Observation Level : q15 minute checks  -- Vital signs: q12 hours  -- Precautions: suicide, elopement, and assault  2. Psychiatric Diagnoses and Treatment:    Acute paranoia with persecutory delusions Major depressive disorder Recent intentional acetaminophen overdose Suicidal ideations with recent suicide attempt by cutting  - Start Abilify 2 mg x 1 dose for symptoms of paranoia, delusions. -If tolerated, increase to 5 mg daily tomorrow.  -f/u EKG, A1c, CMP, vitamin D level  - Start Zoloft 25 mg x 1 dose. If tolerated continue to monitor for any adverse effects   -- The risks/benefits/side-effects/alternatives to this medication were discussed in detail with the patient and time was given for questions. The patient consents to medication trial.   -- Metabolic profile and  EKG monitoring obtained while on an atypical antipsychotic (BMI: Lipid Panel: HbgA1c: QTc:) : f/u   PRN medications for symptomatic management:              -- start acetaminophen 650 mg every 6 hours as needed for mild to moderate pain, fever, and headaches              -- start bismuth subsalicylate 524 mg oral chewable tablet every 3 hours as needed for diarrhea / loose stools              -- start aluminum-magnesium hydroxide + simethicone 30 mL every 4 hours as needed for heartburn or indigestion              --  start melatonin 3 mg bedtime as needed for insomnia  -- As needed agitation protocol in-place  The risks/benefits/side-effects/alternatives to the above medication were discussed in detail with the patient and time was given for questions. The patient consents to medication trial. FDA black box warnings, if present, were discussed.  The patient is agreeable with the medication plan, as above. We will monitor the patient's response to pharmacologic treatment, and adjust medications as necessary.  3. Routine and other pertinent labs: EKG monitoring: QTc: EKG ordered on 9/24  Transaminitis, resolving Hypokalemia, resolving CMP this morning: improving K 3.4 L, calcium 8.8 L, low albumin 3.4. (correected ca 8.9), elevated total bili downtrending.   - f/u CMP, lipid panel, A1c - monitor LFTs, electrolytes and replete PRN.   Metabolism / endocrine: BMI: Body mass index is 27.6 kg/m. Prolactin: No results found for: "PROLACTIN" Lipid Panel: No results found for: "CHOL", "TRIG", "HDL", "CHOLHDL", "VLDL", "LDLCALC" HbgA1c: No results found for: "HGBA1C" TSH: TSH (uIU/mL)  Date Value  01/18/2023 0.954    Drugs of Abuse     Component Value Date/Time   LABOPIA NONE DETECTED 01/17/2023 1422   COCAINSCRNUR NONE DETECTED 01/17/2023 1422   LABBENZ NONE DETECTED 01/17/2023 1422   AMPHETMU NONE DETECTED 01/17/2023 1422   THCU NONE DETECTED 01/17/2023 1422   LABBARB NONE DETECTED 01/17/2023 1422     4. Group Therapy:  -- Encouraged patient to participate in unit milieu and in scheduled group therapies   -- Short Term Goals: Ability to identify changes in lifestyle to reduce recurrence of condition, verbalize feelings, identify and develop effective coping behaviors, maintain clinical measurements within normal limits, and identify triggers associated with substance abuse/mental health issues will improve. Improvement in ability to disclose and discuss suicidal ideas, demonstrate self-control,  and comply with prescribed medications.  -- Long Term Goals: Improvement in symptoms so as ready for discharge -- Patient is encouraged to participate in group therapy while admitted to the psychiatric unit. -- We will address other chronic and acute stressors, which contributed to the patient's MDD (major depressive disorder), recurrent episode, severe (HCC) in order to reduce the risk of self-harm at discharge.  5. Discharge Planning:   -- Social work and case management to assist with discharge planning and identification of hospital follow-up needs prior to discharge  -- Estimated LOS: 5-7 days  -- Discharge Concerns: Need to establish a safety plan; Medication compliance and effectiveness  -- Discharge Goals: Return home with outpatient referrals for mental health follow-up including medication management/psychotherapy  I certify that inpatient services furnished can reasonably be expected to improve the patient's condition.  Signed: Milbert Coulter, Medical Student 01/20/2023, 2:16 PM

## 2023-01-20 NOTE — [facility] Group Notes (Signed)
Adult Psychoeducational Group Note  Date:  01/20/2023 Time:  9:16 PM  Group Topic/Focus:  Wrap-Up Group:   The focus of this group is to help patients review their daily goal of treatment and discuss progress on daily workbooks.  Participation Level:  Did not attend group.  Participation Quality:  Appropriate  Affect:  Appropriate  Cognitive:  Appropriate  Insight: Appropriate  Engagement in Group:  Engaged  Modes of Intervention:  Discussion  Additional Comments:  Pt did not attend group.  Joselyn Arrow 01/20/2023, 9:16 PM

## 2023-01-20 NOTE — Group Note (Signed)
LCSW Group Therapy Note  Group Date: 01/20/2023 Start Time: 1100 End Time: 1145   Type of Therapy and Topic:  Group Therapy - How To Cope with Nervousness about Discharge   Participation Level:  Did Not Attend   Description of Group This process group involved identification of patients' feelings about discharge. Some of them are scheduled to be discharged soon, while others are new admissions, but each of them was asked to share thoughts and feelings surrounding discharge from the hospital. One common theme was that they are excited at the prospect of going home, while another was that many of them are apprehensive about sharing why they were hospitalized. Patients were given the opportunity to discuss these feelings with their peers in preparation for discharge.  Therapeutic Goals  Patient will identify their overall feelings about pending discharge. Patient will think about how they might proactively address issues that they believe will once again arise once they get home (i.e. with parents). Patients will participate in discussion about having hope for change.    Therapeutic Modalities Cognitive Behavioral Therapy   Ane Payment, LCSW 01/20/2023  1:26 PM

## 2023-01-20 NOTE — Group Note (Signed)
Recreation Therapy Group Note   Group Topic:Animal Assisted Therapy   Group Date: 01/20/2023 Start Time: 0945 End Time: 1030 Facilitators: Taniqua Issa-McCall, LRT,CTRS Location: [address] Dayroom   Animal-Assisted Activity (AAA) Program Checklist/Progress Notes Patient Eligibility Criteria Checklist & Daily Group note for Rec Tx Intervention  AAA/T Program Assumption of Risk Form signed by Patient/ or Parent Legal Guardian Yes  Patient understands his/her participation is voluntary Yes   Affect/Mood: N/A   Participation Level: Did not attend    Clinical Observations/Individualized Feedback:    Plan: Continue to engage patient in RT group sessions 2-3x/week.   Aamya Orellana-McCall, LRT,CTRS  01/20/2023 1:15 PM

## 2023-01-20 NOTE — Progress Notes (Signed)
Suicide Risk Assessment  Admission Assessment    [facility] Admission Suicide Risk Assessment  Nursing information obtained from:    Demographic factors:  NA Current Mental Status:  Suicidal ideation indicated by patient Loss Factors:  NA Historical Factors:  NA Risk Reduction Factors:  Responsible for children under 47 years of age, Sense of responsibility to family, Living with another person, especially a relative, Positive social support  Total Time spent with patient: 1 hour Principal Problem: MDD (major depressive disorder), recurrent episode, severe (HCC) Diagnosis:  Principal Problem:   MDD (major depressive disorder), recurrent episode, severe (HCC) Active Problems:   Suicidal ideations   Acute paranoia (HCC)   Subjective Data:  History of Present Illness:  Carolyn Salas is a 47 y.o., female with no past psychiatric history who presents to the [facility] Voluntary from [facility] Emergency Department for evaluation and management of suicidal thoughts, recent intentional acetaminophen overdose, and paranoia.   HPI: The patient was brought to [facility] from home by her husband on 9/21 after an intentional acetaminophen overdose on 9/18 and for intentionally cutting her neck and arms to inflict self-harm.   This morning pt reports she is in the hospital because she feels paranoid. Since August she feels like she is being watched and listened to, and she feels this is out of her control and being done by some unknown source with an unknown goal. She feels like this is interfering with her life and feels this source is speaking to her through "other [people] always having staged conversations" and sending her messages about her mistakes related to work to make her feel apologetic. She acknowledges she makes mistakes because she is human, and she is not worried about the messages in relation to herself because she is not causing other people's discomfort. States paranoia is  unchanged in intensity, frequency since it began in August, and it is not alleviated or worsened by any factors. Because of this huge change in her life with work and feeling watched, she wanted to go to sleep and not wake up. Denies seeing or hearing the entity that is watching/listening to her. 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 She does not know the time course of being fired or if she will be dismissed.    Today pt reports feeling guilty, having decreased appetite by choice, and having some thoughts of wanting to go to sleep and not wake up due to the overwhelm of her whole life changing. Denies suicidal plan and intent. Denies HI, AVH. Pt denies feeling depressed, having problems with sleep, energy, restlessness. Denies symptoms of mania including rapid or fast speech, increased impulsivity or grandiosity, concentration issues.  Denies trauma, abuse. Denies OCD symptoms.   Mode of transport to Hospital: family car via husband Current Outpatient (Home) Medication List: No prescriptions, otc, supplements PRN medication prior to evaluation: None   Chart review: On chart review, prior to this evaluation, patient intentionally took 50 tablets of regular strength Tylenol in a self-harm attempt. She presented to [facility] emergency department by husband from home on 01/17/2023 for intentional acetaminophen overdose, complaint of suicidal ideation. Per chart review husband reported pt also had superficial cuts to neck and wrists. Pt did not take additional tylenol or other medications with intent to harm herself. Pt reported nausea/vomiting after taking acetaminophen, but was asymptomatic upon presentation to the ED. Patient vital signs in ED: afebrile, HR 79-90s, BP 130s-150 systolic, oxygen  saturation 100% on room air.    Upon admission: Labs showed significantly elevated LFTS: AST 1204, ALT 1694. Normal T. bili and alkaline phosphatase. Normal  acetaminophen level. Negative alcohol level. INR. Normal ammonia. Normal CBC. Urine drug screen negative. CT abdomen with IV contrast showed no acute GI changes.  Poison control consulted: recommended NAC, which was initiated. Patient received LR. CT abdomen/pelvis: no acute abdominal process   Pt admitted for further evaluation of intentional acetaminophen overdose in the setting of SI complicated by acute transaminitis, hypokalemia. NAC revealed resolving acute transaminitis with liver enzymes trending down.   At [facility], pt reported no history of mental illness or substance abuse. Reported attempted suicide due to poor decisions. Reported severe depression characterized by recurrent suicidal thoughts, excessive sleep, poor appetite, lack of motivation, paranoia and low energy level. She denied drugs, alcohol use, homicidal thoughts, anxiety and psychosis .   Discharge recommendations: check LFTs in one week (9/28) Poison control signed off, recommended to discontinue NAC, cleared to transfer to [facility] when pt stable.     Subjective Sleep past 24 hours: fair Subjective Appetite past 24 hours: "decreased but by choice"   9/24 Collateral information obtained Alphia Moh, patient's husband) Patient granted permission to speak to contact person without restrictions. During this conversation, I obtained further information regarding patient's history.  Patient's husband recounts that patient's symptoms started about halfway through the second of week of school  (approximately 12/31/22), with patient started to become agitated. Told husband she "felt set up for failure" by her boss. She reported her boss how she felt, asked for feedback, and was reassured that she was doing well at work. Patient went up chain of command to next boss and HR because she felt paranoid, frustrated, and not heard. She was again reassured there were no problems at work. According to husband pt felt her bosses were part of  a conspiracy to get her out of work. Next, she started talking about feeling followed, people were listening to her conversations, and that technology at school and at home were bugged or modified.    Patient took a leave of absence at work, and in 1.5 weeks her symptoms of paranoia was constant and continued to worsen. She became increasingly worried about her children leaving for school, became uncharacteristically and increasingly affectionate with the kids and hugged them before they left the house. Because of fear the phone was bugged and because she was receiving increased calls from concerned colleagues, patient changed her phone and phone number. When new phone had issues getting set up, pt attributed tech problems to the organization spying on them. Husband reports patient had decreased sleep, decreased appetite for the past week, decreased energy (pt feeling tired, staying in bed), and increased restlessness (leg shaking often). Denies rapid speech, problems in communicating clearly or speaking illogically, risky behavior, grandiosity, auditory or visual hallucinations.   On 9/18 patient told husband she had a headache and said she took a couple of Tylenol. She was up all night vomiting. Two days later, pt was agitated in the evening and had a conversation with her husband that she felt they would not be watched or listened in on anymore if she resigned from her job. Eventually she went to bed before her husband, who saw her sleeping soundly.  Following morning (on 9/21) patient noticed some blood on comforter. Asked pt if she had a nosebleed, and she covered herself with the sheets. Husband pulled away the sheets to see patient  had cut her neck and arms, and he rushed her to the hospital. In the ED her liver enzymes were abnormally high, and after husband asked her multiple times, pt revealed she had shaken the bottle of Tylenol to her mouth to ingest an unknown number of pills. Husband found empty  tylenol bottle at home. Husband also noted that they keep a gun next to their bed, and he immediately removed it and locked it in a secure safe. He is a Chief Financial Officer, and there are multiple guns appropriately secured in the household in a safe that only he has access to. He is currently coordinating with his brother to remove all weapons and ammunition from their house while patient is in hospital.    At baseline, he reports his wife is sharp, articulate, and well-regarded with her work as an Geophysicist/field seismologist vice principal. She gets along well with her colleagues and was even recognized with a party at work for her achievements during the year. She earned two Master's degrees and graduated top of her class. -Aside from school starting, he denies any triggering events and any previous history of similar episodes or abnormal behavior.  -Denies any PMH of psychiatric and medical conditions, inpatient and outpatient treatment, medications. Denies head injury, seizures.  -Reports pt's family history include mother with an unspecified mental illness where "the roles are reversed: her mother acts like a child and [patient] behaves as the mom" by taking care of whatever pt's mother needs. Potentially her biological father's side has mental disorder, possibly bipolar or schizophrenia but not sure. Denies any family history of suicide.  -Social history: no alcohol in house, pt never drank, smoked, used recreational substances or abused prescriptions. Denies abuse/trauma. States patient had an interesting childhood: She was raised by a couple, and patient believed her father figure was her biological dad. Unfortunately when he was trying to get custody of her, paternity test revealed he was not the father, so patient and sibling were sent to live with grandma.      Past Psychiatric History:  Previous psych diagnoses: Denies Prior inpatient psychiatric treatment: Denies Prior outpatient psychiatric treatment:  Denies Current psychiatric provider: Denies   Neuromodulation history: denies   Current therapist: Denies Psychotherapy hx: Denies   History of suicide attempts: Denies History of homicide: Denies   Psychotropic medications: None   Substance Use History: Alcohol: never drinks Tobacco: never smoked, vaped, or chewed tobacco Cannabis (marijuana): never tried Cocaine: never tried Methamphetamines: never tried Psilocybin (mushrooms): never tried Ecstasy (MDMA / molly): never tried LSD (acid): never tried Opiates (fentanyl / heroin): never tried Benzos (Xanax, Klonopin): never tried IV drug use: Never Prescribed meds abuse: Never   History of detox: Never History of rehab: Never   Is the patient at risk to self? Yes Has the patient been a risk to self in the past 6 months? Yes Has the patient been a risk to self within the distant past? No Is the patient a risk to others? Yes Has the patient been a risk to others in the past 6 months? Yes Has the patient been a risk to others within the distant past? No   Alcohol Screening: 1. How often do you have a drink containing alcohol?: Never 2. How many drinks containing alcohol do you have on a typical day when you are drinking?: n/a 3. How often do you have six or more drinks on one occasion?: Never AUDIT-C Score: 0   Substance Abuse History in the last 12  months: No   Allergies: endorses the following medication allergies with resulting symptoms: Penicillin   Past Medical/Surgical History:  Medical Diagnoses: None Home Rx: None Prior Hosp: None Prior Surgeries / non-head trauma: None   Head trauma: denies LOC: denies Concussions: denies Seizures: denies   Last menstrual period and contraceptives:    Family History:  Medical: Possibly father with diabetes Psych: Mother - "in her own world", disabled Psych Rx: Unknown Suicide: Unknown, denies Homicide: Denies Substance use family hx: Denies   Social History:   Place of birth and grew up where: [place], [place]; grew up in [place] Abuse: no history of abuse Marital Status: Married to [place] for 15 years. Two children together. Raising godson for 8 years Sexual orientation: straight Children: 2 with current partner. 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 See collateral provided in HPI by husband for further details Pills stockpile: Currently none    Continued Clinical Symptoms:  Paranoia with persecutory delusions Depression Suicidal thoughts- passive, no plan, no intent  CLINICAL FACTORS:   More than one psychiatric diagnosis Medical Diagnoses and Treatments/Surgeries  Musculoskeletal: Strength & Muscle Tone: within normal limits Gait & Station: normal Patient leans: N/A  Psychiatric Specialty Exam: General Appearance:  Laying down in bed with sheets pulled up to face. Appropriately groomed    Eye Contact:  Minimal,     Speech:  Clear and Coherent; Slow    Volume:  Decreased    Mood:  "I don't really know"    Affect:  Congruent, dysphoric, cautious    Thought Content:  Persecutory delusions, ideas of reference    Suicidal Thoughts: "sometimes I feel like I want to go to sleep and not wake up." No plan, no intent  Homicidal Thoughts: Denies HI  Thought Process:  Logical, linear, no loose associations    Orientation:  Full (Time, Place and Person)      Memory:  Immediate Good; Recent Good; Remote Good    Judgment:  Poor    Insight:  Lacking    Concentration:  Fair    Recall:  Good    Fund of Knowledge:  Good    Language:  Good    Psychomotor Activity: No data recorded  Assets:  Communication Skills    Sleep: No data  recorded     Physical Exam:  Vitals and nursing note reviewed.  Constitutional:      General: She is not in acute distress.    Appearance: She is not ill-appearing or toxic-appearing.     Comments: Somnolent  HENT:     Head: Normocephalic.  Pulmonary:     Effort: Pulmonary effort is normal.  Neurological:     Mental Status: She is oriented to person, place, and time and easily aroused.    Review of Systems Review of Systems  Constitutional:  Positive for malaise/fatigue.  Gastrointestinal: Negative.   Genitourinary: Negative.   Musculoskeletal: Negative.   Neurological:  Negative for dizziness, weakness and headaches.  Psychiatric/Behavioral:  The patient is not nervous/anxious.    Blood pressure (!) 119/90, pulse 75, temperature 99 F (37.2 C), temperature source Oral, resp. rate 18, height 5\' 7"  (1.702 m), weight 79.9 kg, SpO2 100%. Body mass index is 27.6 kg/m.  COGNITIVE FEATURES THAT CONTRIBUTE TO RISK:  Thought constriction (tunnel vision)    SUICIDE RISK:   Moderate:  Frequent suicidal ideation with limited intensity, and duration, some specificity in terms of plans, no associated intent, good self-control, limited dysphoria/symptomatology, some risk factors present, and identifiable protective factors, including available and accessible social support.  PLAN OF CARE: see H&P for full plan of care  I certify that inpatient services furnished can reasonably be expected to improve the patient's condition.   Signed: Milbert Coulter, Medical Student 01/20/2023, 2:24 PM

## 2023-01-20 NOTE — Progress Notes (Signed)
Patient is refusing EKG and medications stating "my heart and my mood are fine". MD notified.

## 2023-01-20 NOTE — Progress Notes (Signed)
Carolyn Salas is sleeping but arouses easily. She is slow to respond verbally. She is very cautious fo staff and guarded. Denies thoughts of SI and denies SI/HI/AVH. She slept 10 hrs last pm and remains in bed sleeping. She is isolating from peers and staff. Vital signs are stable and bandages remain intact on superficial cutting to neck, left arm and steri strips on chest. Respirations even and unlabored with no voicing complaints.

## 2023-01-20 NOTE — Progress Notes (Signed)
D) Pt received in room, and stayed in room throughout shift A) Pt encouraged to drink fluids. Pt encouraged to come to staff with needs. Pt encouraged to attend and participate in groups. Pt encouraged to set reachable goals.  R) Pt remained safe on unit, in no acute distress, will continue to assess.     01/20/23 2100  Psych Admission Type (Psych Patients Only)  Admission Status Voluntary  Psychosocial Assessment  Patient Complaints Isolation  Eye Contact None  Facial Expression Flat  Affect Depressed  Speech Logical/coherent  Interaction Cautious  Motor Activity Slow  Appearance/Hygiene Disheveled  Behavior Characteristics Guarded  Mood Sad  Thought Process  Coherency WDL  Content WDL  Delusions None reported or observed  Perception WDL  Hallucination None reported or observed  Judgment Poor  Confusion None  Danger to Self  Current suicidal ideation? Denies  Agreement Not to Harm Self Yes  Description of Agreement verbal  Danger to Others  Danger to Others None reported or observed  Danger to Others Abnormal  Harmful Behavior to others No threats or harm toward other people

## 2023-01-20 NOTE — Progress Notes (Signed)
Carolyn Salas, is sleeping but arouses easily to voice.Temp 98.4 Respirations even and unlabored at 16.She continues to isolate from staff and peers.

## 2023-01-20 NOTE — B[facility] Counselor (Signed)
Adult Comprehensive Assessment  Patient ID: Carolyn Salas, female   DOB: 31-Jul-1975, 47 y.o.   MRN: [id number]  Information Source: Information source: Patient  Current Stressors:  Patient states their primary concerns and needs for treatment are:: "I just have a lot of work related stress right now." Patient states their goals for this hospitilization and ongoing recovery are:: "To know what to do at the end of this leave" Educational / Learning stressors: none reported Employment / Job issues: "There have been a lot of enviormental changes at work" Family Relationships: none reported Surveyor, quantity / Lack of resources (include bankruptcy): none reported Housing / Lack of housing: none reported Physical health (include injuries & life threatening diseases): none reported Social relationships: none reported Substance abuse: none reported Bereavement / Loss: none reported  Living/Environment/Situation:  Living Arrangements: Children, Spouse/significant other Living conditions (as described by patient or guardian): Pt reports that she lives with husband, 7 yo daughter and 57 yo god son. Who else lives in the home?: "My husband and 1 children" How long has patient lived in current situation?: "15 years" What is atmosphere in current home: Comfortable  Family History:  Marital status: Married Number of Years Married: 15 What types of issues is patient dealing with in the relationship?: "No issues" Additional relationship information: NA Are you sexually active?: Yes What is your sexual orientation?: HeterosexualNA Does patient have children?: Yes How many children?: 6 How is patient's relationship with their children?: "I have 6 kids and I have a good relationship with them all"  Childhood History:  Additional childhood history information: Pt reports that grandmother had custody of her but she did have contact with mom and dad. Description of patient's relationship with caregiver when  they were a child: Good Patient's description of current relationship with people who raised him/her: Good How were you disciplined when you got in trouble as a child/adolescent?: Normal Does patient have siblings?: Yes Number of Siblings: 3 Description of patient's current relationship with siblings: "ok" Did patient suffer any verbal/emotional/physical/sexual abuse as a child?: No Did patient suffer from severe childhood neglect?: No Has patient ever been sexually abused/assaulted/raped as an adolescent or adult?: No Was the patient ever a victim of a crime or a disaster?: No Witnessed domestic violence?: No Has patient been affected by domestic violence as an adult?: No  Education:  Highest grade of school patient has completed: Education officer, museum" Currently a Consulting civil engineer?: No Learning disability?: No  Employment/Work Situation:   Employment Situation: Employed 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?: No  Financial Resources:   Financial resources: Income from employment Does patient have a representative payee or guardian?: No  Alcohol/Substance Abuse:   What has been your use of drugs/alcohol within the last 12 months?: "no" If attempted suicide, did drugs/alcohol play a role in this?: No Alcohol/Substance Abuse Treatment Hx: Denies past history If yes, describe treatment: NA Has alcohol/substance abuse ever caused legal problems?: No  Social Support System:   Patient's Community Support System: Good Describe Community Support System:  "My husband" Type of faith/religion:  christian How does patient's faith help to cope with current illness?: go to church  Leisure/Recreation:   Do You Have Hobbies?: Yes Leisure and Hobbies: Reading  Strengths/Needs:   What is the patient's perception of their strengths?: "I'm helpful" Patient states they can use these personal strengths during their treatment to contribute to their recovery: NA Patient states these barriers may affect/interfere with their treatment: "I can't think of anything" Patient states these barriers may affect their return to the community: NA Other important information patient would like considered in planning for their treatment: NA  Discharge Plan:   Currently receiving community mental health services: No Patient states concerns and preferences for aftercare planning are: "I need a therapist" Patient states they will know when they are safe and ready for discharge when: NA Does patient have access to transportation?: Yes Does patient have financial barriers related to discharge medications?: No Patient description of barriers related to discharge medications: NA Will patient be returning to same living situation after discharge?: Yes  Summary/Recommendations:   Summary and Recommendations (to be completed by the evaluator): Ayda is a 47 yo female who presented to [facility] due to depression, work stress and SI. Pt reports that her only stress in life right now is "enviromental changes" at work. Pt lives at home with husband and 2 children and plans to return home to them when discharged. Pt does not have any mental health services setup and requested we help her with that when discharged. Pt was in bed when CSW arrived for assessment, very little to no eye contact, did not want to answer questions, needed them repeated and seemed upset to answer them.While here, Jarrett Soho, can benefit from crisis stabilization, medication management, therapeutic milieu, and  referrals for services.   Izell Hebron. 01/20/2023

## 2023-01-21 ENCOUNTER — Encounter (HOSPITAL_COMMUNITY): Payer: Self-pay

## 2023-01-21 MED ORDER — POTASSIUM CHLORIDE 20 MEQ PO PACK
20.0000 meq | PACK | Freq: Two times a day (BID) | ORAL | Status: AC
  Filled 2023-01-21 (×6): qty 1

## 2023-01-21 NOTE — Plan of Care (Signed)
  Problem: Education: Goal: Emotional status will improve 01/21/2023 0954 by Huntley Dec, RN Outcome: Progressing 01/21/2023 0954 by Huntley Dec, RN Outcome: Progressing Goal: Mental status will improve 01/21/2023 0954 by Huntley Dec, RN Outcome: Progressing 01/21/2023 0954 by Huntley Dec, RN Outcome: Progressing   Problem: Activity: Goal: Interest or engagement in activities will improve 01/21/2023 0954 by Huntley Dec, RN Outcome: Not Progressing 01/21/2023 0954 by Huntley Dec, RN Outcome: Progressing Goal: Sleeping patterns will improve 01/21/2023 0954 by Huntley Dec, RN Outcome: Not Progressing 01/21/2023 0954 by Huntley Dec, RN Outcome: Progressing

## 2023-01-21 NOTE — Group Note (Signed)
Recreation Therapy Group Note   Group Topic:Communication  Group Date: 01/21/2023 Start Time: 0932 End Time: 1005 Facilitators: Aniston Christman-McCall, LRT,CTRS Location: [address] Dayroom   Group Topic: Communication, Problem Solving   Goal Area(s) Addresses:  Patient will effectively listen to complete activity.  Patient will identify communication skills used to make activity successful.  Patient will identify how skills used during activity can be used to reach post d/c goals.    Intervention: Building surveyor Activity - Geometric pattern cards, pencils, blank paper    Group Description: Geometric Drawings.  Three volunteers from the peer group will be shown an abstract picture with a particular arrangement of geometrical shapes.  Each round, one 'speaker' will describe the pattern, as accurately as possible without revealing the image to the group.  The remaining group members will listen and draw the picture to reflect how it is described to them. Patients with the role of 'listener' cannot ask clarifying questions but, may request that the speaker repeat a direction. Once the drawings are complete, the presenter will show the rest of the group the picture and compare how close each person came to drawing the picture. LRT will facilitate a post-activity discussion regarding effective communication and the importance of planning, listening, and asking for clarification in daily interactions with others.  Education: Environmental consultant, Active listening, Support systems, Discharge planning  Education Outcome: Acknowledges understanding/In group clarification offered/Needs additional education.    Affect/Mood: N/A   Participation Level: Did not attend    Clinical Observations/Individualized Feedback:     Plan: Continue to engage patient in RT group sessions 2-3x/week.   Vernie Vinciguerra-McCall, LRT,CTRS  01/21/2023 12:35 PM

## 2023-01-21 NOTE — Progress Notes (Addendum)
Patient refused prescribed potassium chloride despite encouragement from this RN.

## 2023-01-21 NOTE — Progress Notes (Signed)
Patient refused labs this evening despite encouragement from this RN. Patient reports "I already had my blood drawn here I don't want it drawn again".

## 2023-01-21 NOTE — BH IP Treatment Plan (Signed)
Interdisciplinary Treatment and Diagnostic Plan Initial  01/21/2023 Time of Session: 1110 Carolyn Salas MRN: [id number]  Principal Diagnosis: MDD (major depressive disorder), recurrent episode, severe (HCC)  Secondary Diagnoses: Principal Problem:   MDD (major depressive disorder), recurrent episode, severe (HCC) Active Problems:   Suicidal ideations   Acute paranoia (HCC)   Current Medications:  Current Facility-Administered Medications  Medication Dose Route Frequency Provider Last Rate Last Admin   acetaminophen (TYLENOL) tablet 650 mg  650 mg Oral Q6H PRN Starkes-Perry, Juel Burrow, FNP       alum & mag hydroxide-simeth (MAALOX/MYLANTA) 200-200-20 MG/5ML suspension 30 mL  30 mL Oral Q4H PRN Starkes-Perry, Juel Burrow, FNP       diphenhydrAMINE (BENADRYL) capsule 50 mg  50 mg Oral TID PRN Starkes-Perry, Juel Burrow, FNP       Or   diphenhydrAMINE (BENADRYL) injection 50 mg  50 mg Intramuscular TID PRN Starkes-Perry, Juel Burrow, FNP       feeding supplement (ENSURE ENLIVE / ENSURE PLUS) liquid 237 mL  237 mL Oral BID BM Attiah, Nadir, MD       haloperidol (HALDOL) tablet 5 mg  5 mg Oral TID PRN Starkes-Perry, Juel Burrow, FNP       Or   haloperidol lactate (HALDOL) injection 5 mg  5 mg Intramuscular TID PRN Starkes-Perry, Juel Burrow, FNP       ibuprofen (ADVIL) tablet 400 mg  400 mg Oral Q6H PRN Starkes-Perry, Juel Burrow, FNP       LORazepam (ATIVAN) tablet 2 mg  2 mg Oral TID PRN Starkes-Perry, Juel Burrow, FNP       Or   LORazepam (ATIVAN) injection 2 mg  2 mg Intramuscular TID PRN Starkes-Perry, Juel Burrow, FNP       magnesium hydroxide (MILK OF MAGNESIA) suspension 30 mL  30 mL Oral Daily PRN Starkes-Perry, Juel Burrow, FNP       melatonin tablet 3 mg  3 mg Oral QHS PRN Starkes-Perry, Juel Burrow, FNP       potassium chloride (KLOR-CON) packet 20 mEq  20 mEq Oral BID Rex Kras, MD       PTA Medications: No medications prior to admission.    Patient Stressors: Educational concerns    Patient Strengths:  Average or above average intelligence  Supportive family/friends   Treatment Modalities: Medication Management, Group therapy, Case management,  1 to 1 session with clinician, Psychoeducation, Recreational therapy.   Physician Treatment Plan for Primary Diagnosis: MDD (major depressive disorder), recurrent episode, severe (HCC) Long Term Goal(s): Improvement in symptoms so as ready for discharge   Short Term Goals: Ability to identify changes in lifestyle to reduce recurrence of condition will improve Ability to disclose and discuss suicidal ideas Ability to identify and develop effective coping behaviors will improve Ability to maintain clinical measurements within normal limits will improve Compliance with prescribed medications will improve  Medication Management: Evaluate patient's response, side effects, and tolerance of medication regimen.  Therapeutic Interventions: 1 to 1 sessions, Unit Group sessions and Medication administration.  Evaluation of Outcomes: Progressing  Physician Treatment Plan for Secondary Diagnosis: Principal Problem:   MDD (major depressive disorder), recurrent episode, severe (HCC) Active Problems:   Suicidal ideations   Acute paranoia (HCC)  Long Term Goal(s): Improvement in symptoms so as ready for discharge   Short Term Goals: Ability to identify changes in lifestyle to reduce recurrence of condition will improve Ability to disclose and discuss suicidal ideas Ability to identify and develop effective coping behaviors will  improve Ability to maintain clinical measurements within normal limits will improve Compliance with prescribed medications will improve     Medication Management: Evaluate patient's response, side effects, and tolerance of medication regimen.  Therapeutic Interventions: 1 to 1 sessions, Unit Group sessions and Medication administration.  Evaluation of Outcomes: Progressing   RN Treatment Plan for Primary Diagnosis: MDD  (major depressive disorder), recurrent episode, severe (HCC) Long Term Goal(s): Knowledge of disease and therapeutic regimen to maintain health will improve  Short Term Goals: Ability to remain free from injury will improve, Ability to verbalize frustration and anger appropriately will improve, Ability to demonstrate self-control, Ability to participate in decision making will improve, Ability to verbalize feelings will improve, Ability to disclose and discuss suicidal ideas, Ability to identify and develop effective coping behaviors will improve, and Compliance with prescribed medications will improve  Medication Management: RN will administer medications as ordered by provider, will assess and evaluate patient's response and provide education to patient for prescribed medication. RN will report any adverse and/or side effects to prescribing provider.  Therapeutic Interventions: 1 on 1 counseling sessions, Psychoeducation, Medication administration, Evaluate responses to treatment, Monitor vital signs and CBGs as ordered, Perform/monitor CIWA, COWS, AIMS and Fall Risk screenings as ordered, Perform wound care treatments as ordered.  Evaluation of Outcomes: Progressing   LCSW Treatment Plan for Primary Diagnosis: MDD (major depressive disorder), recurrent episode, severe (HCC) Long Term Goal(s): Safe transition to appropriate next level of care at discharge, Engage patient in therapeutic group addressing interpersonal concerns.  Short Term Goals: Engage patient in aftercare planning with referrals and resources, Increase social support, Increase ability to appropriately verbalize feelings, Increase emotional regulation, Facilitate acceptance of mental health diagnosis and concerns, Facilitate patient progression through stages of change regarding substance use diagnoses and concerns, Identify triggers associated with mental health/substance abuse issues, and Increase skills for wellness and  recovery  Therapeutic Interventions: Assess for all discharge needs, 1 to 1 time with Social worker, Explore available resources and support systems, Assess for adequacy in community support network, Educate family and significant other(s) on suicide prevention, Complete Psychosocial Assessment, Interpersonal group therapy.  Evaluation of Outcomes: Progressing   Progress in Treatment: Attending groups: Yes. Participating in groups: Yes. Taking medication as prescribed: Yes. Toleration medication: Yes. Family/Significant other contact made: No, will contact:   Ines Bloomer (husband) - [phone] Patient understands diagnosis: Yes. Discussing patient identified problems/goals with staff: Yes. Medical problems stabilized or resolved: Yes. Denies suicidal/homicidal ideation: Yes. Issues/concerns per patient self-inventory: Yes. Other: N/A  New problem(s) identified: No, Describe:  None Reported  New Short Term/Long Term Goal(s): medication stabilization, elimination of SI thoughts, development of comprehensive mental wellness plan.   Patient Goals:  Medication Stabilization  Discharge Plan or Barriers: :  Patient recently admitted. CSW will continue to follow and assess for appropriate referrals and possible discharge planning.   Reason for Continuation of Hospitalization: Depression Medication stabilization Suicidal ideation  Estimated Length of Stay: 5-7 Days  Last 3 [place] Suicide Severity Risk Score: Flowsheet Row [facility] (Current[facility]-Admission (Discharged) from 01/17/2023 in [facility]  C-SSRS RISK CATEGORY High Risk High Risk       Last PHQ 2/9 Scores:     No data to display          medication stabilization, elimination of SI thoughts, development of comprehensive mental wellness plan.   Scribe for Treatment Team: Ane Payment,  LCSW 01/21/2023 1:18  PM

## 2023-01-21 NOTE — Progress Notes (Signed)
[facility] MD Progress Note  01/21/2023 12:26 PM Carolyn Salas  MRN:  [id number]  Principal Problem: MDD (major depressive disorder), recurrent episode, severe (HCC) Diagnosis: Principal Problem:   MDD (major depressive disorder), recurrent episode, severe (HCC) Active Problems:   Suicidal ideations   Acute paranoia (HCC)   Reason for Admission:  Carolyn Salas is a 47 y.o., female with no past psychiatric history who presents to the [facility] Voluntary from [facility] Emergency Department for evaluation and management of suicidal thoughts, recent attempt to kill herself by taking an overdose of acetaminophen cutting herself. Patient feels overwhelmed and reports a 3-4 week history of paranoia with persecutory delusions. (admitted on 01/19/2023, total  LOS: 2 days ).   Yesterday the psychiatry team made the following recommendations: - Start Abilify 2 mg x 1 dose for symptoms of paranoia, delusions. -If tolerated, increase to 5 mg daily tomorrow (start 9/25).  -f/u EKG, A1c, CMP, vitamin D level             - Start Zoloft 25 mg x 1 dose. If tolerated continue to monitor for any adverse effects  The patient's chart was reviewed and nursing notes were reviewed. The patient's case was discussed in multidisciplinary team meeting.   - Overnight events to report per chart review / staff report: Patient did not want to take medicines and declined EKG, but later in the afternoon she agreed to take meds prior to dinner. This morning pt's labs were not drawn due to lab scanner not working. - Patient received all scheduled medications. Declined Ensure supplement yesterday and this morning. - Patient did not receive any PRN medications  Information Obtained Today During Patient Interview: The patient was seen and evaluated on the unit. During my first visit with her today the patient was laying down in bed with the blankets pulled to close to her face. She answered "fine" to most questions.  She had no complaints this morning. Her husband came by yesterday, and when asked how it went, she replied, "Fine."  She still felt she was being watched and listened to, and she shrugged and said it does not make her feel any particular way because she can not control whoever is monitoring her.  Patient endorses fair sleep; endorses fair appetite. Denies suicidal thoughts, plant or intent to hurt herself or other people, denies AVH.  Patient does not endorse any side-effects they attribute to medications.  Later in the day patient was sitting up in bed and spoke with examiner in the hall. She appeared cautious and paused before responding to examiner's inquiries. She did not have any questions or concerns. At treatment team, pt stated her goal for the admission was to work on coping skills.  ------------- History of Present Illness:  Carolyn Salas is a 47 y.o., female with no past psychiatric history who presents to the [facility] Voluntary from [facility] Emergency Department for evaluation and management of suicidal thoughts, recent intentional acetaminophen overdose, and paranoia.   HPI: The [facility] by her husband on 9/21 after an intentional acetaminophen overdose on 9/18 and for intentionally cutting her neck and arms to inflict self-harm.   This morning pt reports she is in the hospital because she feels paranoid. Since August she feels like she is being watched and listened to, and she feels this is out of her control and being done by some unknown source with an unknown goal. She feels like  this is interfering with her life and feels this source is speaking to her through "other [people] always having staged conversations" and sending her messages about her mistakes related to work to make her feel apologetic. She acknowledges she makes mistakes because she is human, and she is not worried about the messages in relation to herself because she  is not causing other people's discomfort. States paranoia is unchanged in intensity, frequency since it began in August, and it is not alleviated or worsened by any factors. Because of this huge change in her life with work and feeling watched, she wanted to go to sleep and not wake up. Denies seeing or hearing the entity that is watching/listening to her. 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 She does not know the time course of being fired or if she will be dismissed.    Today pt reports feeling guilty, having decreased appetite by choice, and having some thoughts of wanting to go to sleep and not wake up due to the overwhelm of her whole life changing. Denies suicidal plan and intent. Denies HI, AVH. Pt denies feeling depressed, having problems with sleep, energy, restlessness. Denies symptoms of mania including rapid or fast speech, increased impulsivity or grandiosity, concentration issues.  Denies trauma, abuse. Denies OCD symptoms.   Mode of transport to Hospital: family car via husband Current Outpatient (Home) Medication List: No prescriptions, otc, supplements PRN medication prior to evaluation: None   Chart review: On chart review, prior to this evaluation, patient intentionally took 50 tablets of regular strength Tylenol in a self-harm attempt. She presented to [facility] emergency department by husband from home on 01/17/2023 for intentional acetaminophen overdose, complaint of suicidal ideation. Per chart review husband reported pt also had superficial cuts to neck and wrists. Pt reported nausea/vomiting after taking acetaminophen, but was asymptomatic upon presentation to the ED. Patient vital signs in ED: afebrile, HR 79-90s, BP 130s-150 systolic, oxygen saturation 100% on room air.    Upon admission: Labs showed significantly elevated LFTS: AST 1204, ALT 1694. Normal T. bili and alkaline phosphatase. Normal acetaminophen level.  Negative alcohol level. INR. Normal ammonia. Normal CBC. Urine drug screen negative. CT abdomen with IV contrast showed no acute GI changes.  Poison control consulted: recommended NAC, which was initiated. Patient received LR. CT abdomen/pelvis: no acute abdominal process   Pt admitted for further evaluation of intentional acetaminophen overdose in the setting of SI complicated by acute transaminitis, hypokalemia. NAC revealed resolving acute transaminitis with liver enzymes trending down.   At [facility], pt reported no history of mental illness or substance abuse. Reported attempted suicide due to poor decisions. Reported severe depression characterized by recurrent suicidal thoughts, excessive sleep, poor appetite, lack of motivation, paranoia and low energy level. She denied drugs, alcohol use, homicidal thoughts, anxiety and psychosis .   Discharge recommendations: check LFTs in one week (9/28) Poison control signed off, recommended to discontinue NAC, cleared to transfer to [facility] when pt stable.     Subjective Sleep past 24 hours: fair Subjective Appetite past 24 hours: "decreased but by choice"   9/24 Collateral information obtained Carolyn Salas, patient's husband) Patient granted permission to speak to contact person without restrictions. During this conversation, I obtained further information regarding patient's history.  Patient's husband recounts that patient's symptoms started about halfway through the second of week of school  (approximately 12/31/22), with patient started to become agitated. Told husband she "felt set up  for failure" by her boss. She reported her boss how she felt, asked for feedback, and was reassured that she was doing well at work. Patient went up chain of command to next boss and HR because she felt paranoid, frustrated, and not heard. She was again reassured there were no problems at work. According to husband pt felt her bosses were part of a conspiracy to get  her out of work. Next, she started talking about feeling followed, people were listening to her conversations, and that technology at school and at home were bugged or modified.    Patient took a leave of absence at work, and in 1.5 weeks her symptoms of paranoia was constant and continued to worsen. She became increasingly worried about her children leaving for school, became uncharacteristically and increasingly affectionate with the kids and hugged them before they left the house. Because of fear the phone was bugged and because she was receiving increased calls from concerned colleagues, patient changed her phone and phone number. When new phone had issues getting set up, pt attributed tech problems to the organization spying on them. Husband reports patient had decreased sleep, decreased appetite for the past week, decreased energy (pt feeling tired, staying in bed), and increased restlessness (leg shaking often). Denies rapid speech, problems in communicating clearly or speaking illogically, risky behavior, grandiosity, auditory or visual hallucinations.   On 9/18 patient told husband she had a headache and said she took a couple of Tylenol. She was up all night vomiting. Two days later, pt was agitated in the evening and had a conversation with her husband that she felt they would not be watched or listened in on anymore if she resigned from her job. Eventually she went to bed before her husband, who saw her sleeping soundly.  Following morning (on 9/21) patient noticed some blood on comforter. Asked pt if she had a nosebleed, and she covered herself with the sheets. Husband pulled away the sheets to see patient had cut her neck and arms, and he rushed her to the hospital. In the ED her liver enzymes were abnormally high, and after husband asked her multiple times, pt revealed she had shaken the bottle of Tylenol to her mouth to ingest an unknown number of pills. Husband found empty tylenol bottle at  home. Husband also noted that they keep a gun next to their bed, and he immediately removed it and locked it in a secure safe. He is a Chief Financial Officer, and there are multiple guns appropriately secured in the household in a safe that only he has access to. He is currently coordinating with his brother to remove all weapons and ammunition from their house while patient is in hospital.    At baseline, he reports his wife is sharp, articulate, and well-regarded with her work as an Geophysicist/field seismologist vice principal. She gets along well with her colleagues and was even recognized with a party at work for her achievements during the year. She earned two Master's degrees and graduated top of her class. -Aside from school starting, he denies any triggering events and any previous history of similar episodes or abnormal behavior.  -Denies any PMH of psychiatric and medical conditions, inpatient and outpatient treatment, medications. Denies head injury, seizures.  -Reports pt's family history include mother with an unspecified mental illness where "the roles are reversed: her mother acts like a child and [patient] behaves as the mom" by taking care of whatever pt's mother needs. Potentially her biological father's side has  mental disorder, possibly bipolar or schizophrenia but not sure. Denies any family history of suicide.  -Social history: no alcohol in house, pt never drank, smoked, used recreational substances or abused prescriptions. Denies abuse/trauma. States patient had an interesting childhood: She was raised by a couple, and patient believed her father figure was her biological dad. Unfortunately when he was trying to get custody of her, paternity test revealed he was not the father, so patient and sibling were sent to live with grandma.      Past Psychiatric History:  Previous psych diagnoses: Denies Prior inpatient psychiatric treatment: Denies Prior outpatient psychiatric treatment: Denies Current  psychiatric provider: Denies   Neuromodulation history: denies   Current therapist: Denies Psychotherapy hx: Denies   History of suicide attempts: Denies History of homicide: Denies   Psychotropic medications: None   Substance Use History: Alcohol: never drinks Tobacco: never smoked, vaped, or chewed tobacco Cannabis (marijuana): never tried Cocaine: never tried Methamphetamines: never tried Psilocybin (mushrooms): never tried Ecstasy (MDMA / molly): never tried LSD (acid): never tried Opiates (fentanyl / heroin): never tried Benzos (Xanax, Klonopin): never tried IV drug use: Never Prescribed meds abuse: Never   History of detox: Never History of rehab: Never   Is the patient at risk to self? Yes Has the patient been a risk to self in the past 6 months? Yes Has the patient been a risk to self within the distant past? No Is the patient a risk to others? Yes Has the patient been a risk to others in the past 6 months? Yes Has the patient been a risk to others within the distant past? No   Alcohol Screening: 1. How often do you have a drink containing alcohol?: Never 2. How many drinks containing alcohol do you have on a typical day when you are drinking?: n/a 3. How often do you have six or more drinks on one occasion?: Never AUDIT-C Score: 0   Substance Abuse History in the last 12 months: No   Allergies: endorses the following medication allergies with resulting symptoms: Penicillin   Past Medical/Surgical History:  Medical Diagnoses: None Home Rx: None Prior Hosp: None Prior Surgeries / non-head trauma: None   Head trauma: denies LOC: denies Concussions: denies Seizures: denies   Last menstrual period and contraceptives:    Family History:  Medical: Possibly father with diabetes Psych: Mother - "in her own world", disabled Psych Rx: Unknown Suicide: Unknown, denies Homicide: Denies Substance use family hx: Denies   Social History:  Place of birth and  grew up where: [employer] [place], [place]; grew up in [place] Abuse: no history of abuse Marital Status: Married to [place] for 15 years. Two children together. Raising godson for 8 years Sexual orientation: straight Children: 2 with current partner. 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 See collateral provided in HPI by husband for further details Pills stockpile: Denies  Current Medications: Current Facility-Administered Medications  Medication Dose Route Frequency Provider Last Rate Last Admin   acetaminophen (TYLENOL) tablet 650 mg  650 mg Oral Q6H PRN Starkes-Perry, Juel Burrow, FNP       alum & mag hydroxide-simeth (MAALOX/MYLANTA) 200-200-20 MG/5ML suspension 30 mL  30 mL Oral Q4H PRN Starkes-Perry, Juel Burrow, FNP  diphenhydrAMINE (BENADRYL) capsule 50 mg  50 mg Oral TID PRN Maryagnes Amos, FNP       Or   diphenhydrAMINE (BENADRYL) injection 50 mg  50 mg Intramuscular TID PRN Starkes-Perry, Juel Burrow, FNP       feeding supplement (ENSURE ENLIVE / ENSURE PLUS) liquid 237 mL  237 mL Oral BID BM Attiah, Nadir, MD       haloperidol (HALDOL) tablet 5 mg  5 mg Oral TID PRN Maryagnes Amos, FNP       Or   haloperidol lactate (HALDOL) injection 5 mg  5 mg Intramuscular TID PRN Starkes-Perry, Juel Burrow, FNP       ibuprofen (ADVIL) tablet 400 mg  400 mg Oral Q6H PRN Starkes-Perry, Juel Burrow, FNP       LORazepam (ATIVAN) tablet 2 mg  2 mg Oral TID PRN Starkes-Perry, Juel Burrow, FNP       Or   LORazepam (ATIVAN) injection 2 mg  2 mg Intramuscular TID PRN Starkes-Perry, Juel Burrow, FNP       magnesium hydroxide (MILK OF MAGNESIA) suspension 30 mL  30 mL Oral Daily PRN Starkes-Perry, Juel Burrow, FNP       melatonin tablet  3 mg  3 mg Oral QHS PRN Starkes-Perry, Juel Burrow, FNP        Lab Results: No results found for this or any previous visit (from the past 48 hour(s)).  Blood Alcohol level:  Lab Results  Component Value Date   ETH <10 01/17/2023    Metabolic Labs: No results found for: "HGBA1C", "MPG" No results found for: "PROLACTIN" No results found for: "CHOL", "TRIG", "HDL", "CHOLHDL", "VLDL", "LDLCALC"  Physical Findings:  Psychiatric Specialty Exam: Physical Exam Constitutional:      General: She is not in acute distress.    Appearance: She is not ill-appearing or toxic-appearing.  HENT:     Head: Normocephalic.  Pulmonary:     Effort: Pulmonary effort is normal.  Skin:    Capillary Refill: Capillary refill takes less than 2 seconds.     Comments: Scars from cutting on neck and wrists healing well  Neurological:     General: No focal deficit present.     Mental Status: She is oriented to person, place, and time.     Review of Systems  Constitutional:  Negative for appetite change.  Respiratory:  Negative for shortness of breath.   Genitourinary: Negative.   Neurological:  Negative for dizziness and headaches.    Blood pressure 109/79, pulse (!) 123, temperature 98.1 F (36.7 C), temperature source Oral, resp. rate 18, height 5\' 7"  (1.702 m), weight 79.9 kg, SpO2 100%.Body mass index is 27.6 kg/m.  General Appearance: Fairly Groomed and Guarded  Eye Contact:  Fair  Speech:  Blocked, Clear and Coherent, and Normal Rate  Volume:  Decreased  Mood:   "fine"  Affect:  Constricted and irritable    Thought Process:  Linear  Orientation:  to time, place, self    Thought Content:  Delusions, Ideas of Reference:   Paranoia, and Paranoid Ideation    Suicidal Thoughts:  No  Homicidal Thoughts:  No  Memory:  Immediate,  Good. Recent, further    Judgement:  Impaired  Insight:  Lacking  Psychomotor Activity:  Negative  Concentration:  Concentration: Good  Fund of Knowledge:  Fair   Language:  Good  Akathisia:  Negative  Handed:  Right  AIMS (if indicated):     Assets:  Desire for Improvement Housing  Resilience Social Support Talents/Skills  ADL's:  Intact   Cognition:  WNL  Sleep:  Number of Hours: 10.25    Review of Systems Review of Systems  Constitutional:  Negative for appetite change.  Respiratory:  Negative for shortness of breath.   Genitourinary: Negative.   Neurological:  Negative for dizziness and headaches.    Vital Signs: Blood pressure 109/79, pulse (!) 123, temperature 98.1 F (36.7 C), temperature source Oral, resp. rate 18, height 5\' 7"  (1.702 m), weight 79.9 kg, SpO2 100%. Body mass index is 27.6 kg/m. Physical Exam Constitutional:      General: She is not in acute distress.    Appearance: She is not ill-appearing or toxic-appearing.  HENT:     Head: Normocephalic.  Pulmonary:     Effort: Pulmonary effort is normal.  Skin:    Capillary Refill: Capillary refill takes less than 2 seconds.     Comments: Scars from cutting on neck and wrists healing well  Neurological:     General: No focal deficit present.     Mental Status: She is oriented to person, place, and time.     Assets  Assets: Communication Skills   Treatment Plan Summary: Daily contact with patient to assess and evaluate symptoms and progress in treatment and Medication management  Diagnoses / Active Problems: MDD (major depressive disorder), recurrent episode, severe (HCC) Principal Problem:   MDD (major depressive disorder), recurrent episode, severe (HCC) Active Problems:   Suicidal ideations   Acute paranoia (HCC)   ASSESSMENT: SIONA MONTAG is a 47 y.o., female with no past psychiatric history who presents to the [facility] Voluntary from [facility] Emergency Department for evaluation and management of suicidal thoughts, recent intentional acetaminophen overdose, and paranoia.   PLAN: Safety and Monitoring:             --  Voluntary admission to inpatient psychiatric unit for safety, stabilization and treatment             -- Daily contact with patient to assess and evaluate symptoms and progress in treatment             -- Patient's case to be discussed in multi-disciplinary team meeting             -- Observation Level : q15 minute checks             -- Vital signs: q12 hours             -- Precautions: suicide, elopement, and assault   2. Psychiatric Diagnoses and Treatment:              Acute paranoia with persecutory delusions Major depressive disorder Recent intentional acetaminophen overdose Suicidal ideations with recent suicide attempt by cutting Acute paranoia with persecutory delusions Major depressive disorder Recent intentional acetaminophen overdose Suicidal ideations with recent suicide attempt by cutting             - Increase Abilify 2 mg to 5 mg daily at bedtime for symptoms of paranoia, delusions. -f/u EKG, A1c, CMP, vitamin D level             - Continue Zoloft 25 mg x 1 dose. If tolerated, consider increasing dose. -- The risks/benefits/side-effects/alternatives to this medication were discussed in detail with the patient and time was given for questions. The patient consents to medication trial.              -- Metabolic profile and EKG monitoring obtained while on  an atypical antipsychotic (BMI: Lipid Panel: HbgA1c: QTc:) : f/u     PRN medications for symptomatic management:              -- start acetaminophen 650 mg every 6 hours as needed for mild to moderate pain, fever, and headaches              -- start bismuth subsalicylate 524 mg oral chewable tablet every 3 hours as needed for diarrhea / loose stools              -- start aluminum-magnesium hydroxide + simethicone 30 mL every 4 hours as needed for heartburn or indigestion              -- start melatonin 3 mg bedtime as needed for insomnia             -- As needed agitation protocol in-place   The  risks/benefits/side-effects/alternatives to the above medication were discussed in detail with the patient and time was given for questions. The patient consents to medication trial. FDA black box warnings, if present, were discussed.   The patient is agreeable with the medication plan, as above. We will monitor the patient's response to pharmacologic treatment, and adjust medications as necessary.   3. Routine and other pertinent labs: EKG monitoring: QTc: EKG ordered on 9/24   Transaminitis, resolving Hypokalemia, resolving Previous CMP improving K 3.4 L, calcium 8.8 L, low albumin 3.4. (correected ca 8.9), elevated total bili downtrending.              - f/u CMP, lipid panel, A1c - monitor LFTs, electrolytes and replete PRN.   4. Group Therapy:             -- Encouraged patient to participate in unit milieu and in scheduled group therapies              -- Short Term Goals: Ability to identify changes in lifestyle to reduce recurrence of condition, verbalize feelings, identify and develop effective coping behaviors, maintain clinical measurements within normal limits, and identify triggers associated with substance abuse/mental health issues will improve. Improvement in ability to disclose and discuss suicidal ideas, demonstrate self-control, and comply with prescribed medications.             -- Long Term Goals: Improvement in symptoms so as ready for discharge -- Patient is encouraged to participate in group therapy while admitted to the psychiatric unit. -- We will address other chronic and acute stressors, which contributed to the patient's MDD (major depressive disorder), recurrent episode, severe (HCC) in order to reduce the risk of self-harm at discharge.   5. Discharge Planning:              -- Social work and case management to assist with discharge planning and identification of hospital follow-up needs prior to discharge             -- Estimated LOS: 5-7 days             --  Discharge Concerns: Need to establish a safety plan; Medication compliance and effectiveness             -- Discharge Goals: Return home with outpatient referrals for mental health follow-up including medication management/psychotherapy  I certify that inpatient services furnished can reasonably be expected to improve the patient's condition.   Signed: Milbert Coulter, Medical Student 01/21/2023, 12:26 PM

## 2023-01-21 NOTE — Progress Notes (Signed)
   01/21/23 0900  Psych Admission Type (Psych Patients Only)  Admission Status Voluntary  Psychosocial Assessment  Patient Complaints Isolation  Eye Contact None  Facial Expression Flat  Affect Depressed  Speech Logical/coherent  Interaction Isolative;Minimal  Motor Activity Slow  Appearance/Hygiene Disheveled  Behavior Characteristics Guarded;Irritable  Mood Depressed;Sad  Thought Process  Coherency WDL  Content WDL  Delusions None reported or observed  Perception WDL  Hallucination None reported or observed  Judgment Impaired  Confusion None  Danger to Self  Current suicidal ideation? Denies  Description of Suicide Plan No plan  Self-Injurious Behavior No self-injurious ideation or behavior indicators observed or expressed   Agreement Not to Harm Self Yes  Description of Agreement Pt verbally contracts for safety  Danger to Others  Danger to Others None reported or observed  Danger to Others Abnormal  Harmful Behavior to others No threats or harm toward other people

## 2023-01-21 NOTE — [facility] Group Notes (Signed)

## 2023-01-21 NOTE — Progress Notes (Signed)
Night shift RN reports that patient did not have labs drawn this morning due to lab scanner not working.

## 2023-01-21 NOTE — [facility] Suicide Risk Assessment (Signed)
[facility] INPATIENT:  Family/Significant Other Suicide Prevention Education  Suicide Prevention Education:  Education Completed; Carolyn Salas (husband) - [phone], has been identified by the patient as the family member/significant other with whom the patient will be residing, and identified as the person(s) who will aid the patient in the event of a mental health crisis (suicidal ideations/suicide attempt).  With written consent from the patient, the family member/significant other has been provided the following suicide prevention education, prior to the and/or following the discharge of the patient.  The suicide prevention education provided includes the following: Suicide risk factors Suicide prevention and interventions National Suicide Hotline telephone number [facility] assessment telephone number [facility] Emergency Assistance [facility] and/or Residential Mobile Crisis Unit telephone number  Request made of family/significant other to: Remove weapons (e.g., guns, rifles, knives), all items previously/currently identified as safety concern.   Remove drugs/medications (over-the-counter, prescriptions, illicit drugs), all items previously/currently identified as a safety concern.  The family member/significant other verbalizes understanding of the suicide prevention education information provided.  The family member/significant other agrees to remove the items of safety concern listed above.  Carolyn Salas 01/21/2023, 2:30 PM

## 2023-01-21 NOTE — Progress Notes (Signed)
Pt did not attend NA group 

## 2023-01-21 NOTE — [facility] Group Notes (Signed)
Psychoeducational Group Note  Date:  01/21/2023 Time:  8:30  Group Topic/Focus:  Goals Group:   The focus of this group is to help patients establish daily goals to achieve during treatment and discuss how the patient can incorporate goal setting into their daily lives to aide in recovery.  Participation Level: Did Not Attend  Participation Quality:  Not Applicable  Affect:  Not Applicable  Cognitive:  Not Applicable  Insight:  Not Applicable  Engagement in Group: Not Applicable  Additional Comments:  Pt did not attend goal group due to being asleep.  Blannie Shedlock, Sharen Counter 01/21/2023, 9:22 AM

## 2023-01-22 MED ORDER — SERTRALINE HCL 25 MG PO TABS
25.0000 mg | ORAL_TABLET | Freq: Every day | ORAL | Status: DC
  Administered 2023-01-22: 25 mg via ORAL
  Filled 2023-01-22 (×5): qty 1

## 2023-01-22 MED ORDER — ARIPIPRAZOLE 5 MG PO TABS
5.0000 mg | ORAL_TABLET | Freq: Every day | ORAL | Status: DC
  Administered 2023-01-22: 5 mg via ORAL
  Filled 2023-01-22 (×4): qty 1

## 2023-01-22 NOTE — Progress Notes (Signed)
[facility] MD Progress Note  01/22/2023 4:21 PM Carolyn Salas  MRN:  [id number]  Principal Problem: MDD (major depressive disorder), recurrent episode, severe (HCC) Diagnosis: Principal Problem:   MDD (major depressive disorder), recurrent episode, severe (HCC) Active Problems:   Suicidal ideations   Acute paranoia (HCC)   Reason for Admission:  Carolyn Salas is a 47 y.o., female with no past psychiatric history who presents to the [facility] Voluntary from [facility] Emergency Department for evaluation and management of suicidal thoughts, recent attempt to kill herself by taking an overdose of acetaminophen cutting herself. Patient feels overwhelmed and reports a 3-4 week history of paranoia with persecutory delusions. (admitted on 01/19/2023, total  LOS: 3 days ).   Yesterday the psychiatry team made the following recommendations:   - Increase Abilify 2 mg to 5 mg daily at bedtime for symptoms of paranoia, delusions. -f/u EKG, A1c, CMP, vitamin D level             - Continue Zoloft 25 mg x 1 dose. If tolerated, consider increasing dose.  The patient's chart was reviewed and nursing notes were reviewed. The patient's case was discussed in multidisciplinary team meeting.   - Overnight events to report per chart review / staff report: None - Patient received all scheduled medications except: scheduled potassium chloride supplement - Patient did not receive any PRN medications   Information Obtained Today During Patient Interview: On assessment today, the pt reports that their mood is fine. Patient does not want to leave bed or room despite multiple attempts to encourage her to walk around and attend groups. Reports that anxiety is fine but still believes that she is being watched and listened to. Believes she is constsantly being watched and listened to by everyone. She is cautious when speaking to examiner and to staff. Patient remains in her room and laying down in bed during  most of the day. Discussed importance of taking medications. Patient declined potassium because she was under the impression she did not need it, that it was not required. Patient later insists she has taken all her medications and requests a list of what she is prescribed. I personally wrote out a list of her medications and when they were taken/scheduled, which patient appreciated.   Sleep reported as "fine." Appetite is "fine," but patient has not been eating full meals. Pt states this is by choice. Declining Ensure Enlive supplement. Concentration is good.  Energy level is low, patient rarely leaves room. Denies suicidal thoughts. Denies suicidal intent and plan.  Denies having any HI.  Denies AVH.  Denies having side effects to current psychiatric medications.   We discussed changes to current medication regimen, including increasing Abilify to 5 mg and potassium chloride supplement.  Patient does not endorse any side-effects they attribute to medications.   9/26 Collateral information obtained Alphia Moh, patient's husband) Patient granted permission to speak to contact person without restrictions. During this conversation, I obtained further information regarding patient's history.  Husband reports he has been visiting his wife during visiting hours each night and feels like her paranoia has not improved. He reports pt has poor eye contact and faces away from him during their visits. He also believes she has some memory issues or may be concealing information. For example, his wife is usually particular with her clothing and only bathes with shower shoes on; pt did not remember him dropping off shower shoes and did not say she has been using them. He  also noticed she does not remember what she had eaten during meals that same day.  ------------- History of Present Illness:  Carolyn Salas is a 47 y.o., female with no past psychiatric history who presents to the [facility] Voluntary from [facility] Emergency Department for evaluation and management of suicidal thoughts, recent intentional acetaminophen overdose, and paranoia.   HPI: The patient was brought to [facility] from home by her husband on 9/21 after an intentional acetaminophen overdose on 9/18 and for intentionally cutting her neck and arms to inflict self-harm.   This morning pt reports she is in the hospital because she feels paranoid. Since August she feels like she is being watched and listened to, and she feels this is out of her control and being done by some unknown source with an unknown goal. She feels like this is interfering with her life and feels this source is speaking to her through "other [people] always having staged conversations" and sending her messages about her mistakes related to work to make her feel apologetic. She acknowledges she makes mistakes because she is human, and she is not worried about the messages in relation to herself because she is not causing other people's discomfort. States paranoia is unchanged in intensity, frequency since it began in August, and it is not alleviated or worsened by any factors. Because of this huge change in her life with work and feeling watched, she wanted to go to sleep and not wake up. Denies seeing or hearing the entity that is watching/listening to her. 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 She does not know the time course of being fired or if she will be dismissed.    Today pt reports feeling guilty, having decreased appetite by choice, and having some thoughts of wanting to go to sleep and not wake up due to the overwhelm of her whole life changing. Denies suicidal plan and intent. Denies HI, AVH. Pt denies feeling depressed, having problems with sleep, energy, restlessness. Denies symptoms of mania including rapid or fast speech, increased impulsivity or grandiosity, concentration  issues.  Denies trauma, abuse. Denies OCD symptoms.   Mode of transport to Hospital: family car via husband Current Outpatient (Home) Medication List: No prescriptions, otc, supplements PRN medication prior to evaluation: None   Chart review: On chart review, prior to this evaluation, patient intentionally took 50 tablets of regular strength Tylenol in a self-harm attempt. She presented to [facility] emergency department by husband from home on 01/17/2023 for intentional acetaminophen overdose, complaint of suicidal ideation. Per chart review husband reported pt also had superficial cuts to neck and wrists. Pt reported nausea/vomiting after taking acetaminophen, but was asymptomatic upon presentation to the ED. Patient vital signs in ED: afebrile, HR 79-90s, BP 130s-150 systolic, oxygen saturation 100% on room air.    Upon admission: Labs showed significantly elevated LFTS: AST 1204, ALT 1694. Normal T. bili and alkaline phosphatase. Normal acetaminophen level. Negative alcohol level. INR. Normal ammonia. Normal CBC. Urine drug screen negative. CT abdomen with IV contrast showed no acute GI changes.  Poison control consulted: recommended NAC, which was initiated. Patient received LR. CT abdomen/pelvis: no acute abdominal process   Pt admitted for further evaluation of intentional acetaminophen overdose in the setting of SI complicated by acute transaminitis, hypokalemia. NAC revealed resolving acute transaminitis with liver enzymes trending down.   At [facility], pt reported no history of mental illness or substance abuse.  Reported attempted suicide due to poor decisions. Reported severe depression characterized by recurrent suicidal thoughts, excessive sleep, poor appetite, lack of motivation, paranoia and low energy level. She denied drugs, alcohol use, homicidal thoughts, anxiety and psychosis .   Discharge recommendations: check LFTs in one week (9/28) Poison control signed off, recommended  to discontinue NAC, cleared to transfer to [facility] when pt stable.     9/24 Collateral information obtained Alphia Moh, patient's husband) Patient granted permission to speak to contact person without restrictions. During this conversation, I obtained further information regarding patient's history.  Patient's husband recounts that patient's symptoms started about halfway through the second of week of school  (approximately 12/31/22), with patient started to become agitated. Told husband she "felt set up for failure" by her boss. She reported her boss how she felt, asked for feedback, and was reassured that she was doing well at work. Patient went up chain of command to next boss and HR because she felt paranoid, frustrated, and not heard. She was again reassured there were no problems at work. According to husband pt felt her bosses were part of a conspiracy to get her out of work. Next, she started talking about feeling followed, people were listening to her conversations, and that technology at school and at home were bugged or modified.    Patient took a leave of absence at work, and in 1.5 weeks her symptoms of paranoia was constant and continued to worsen. She became increasingly worried about her children leaving for school, became uncharacteristically and increasingly affectionate with the kids and hugged them before they left the house. Because of fear the phone was bugged and because she was receiving increased calls from concerned colleagues, patient changed her phone and phone number. When new phone had issues getting set up, pt attributed tech problems to the organization spying on them. Husband reports patient had decreased sleep, decreased appetite for the past week, decreased energy (pt feeling tired, staying in bed), and increased restlessness (leg shaking often). Denies rapid speech, problems in communicating clearly or speaking illogically, risky behavior, grandiosity, auditory or visual  hallucinations.   On 9/18 patient told husband she had a headache and said she took a couple of Tylenol. She was up all night vomiting. Two days later, pt was agitated in the evening and had a conversation with her husband that she felt they would not be watched or listened in on anymore if she resigned from her job. Eventually she went to bed before her husband, who saw her sleeping soundly.  Following morning (on 9/21) patient noticed some blood on comforter. Asked pt if she had a nosebleed, and she covered herself with the sheets. Husband pulled away the sheets to see patient had cut her neck and arms, and he rushed her to the hospital. In the ED her liver enzymes were abnormally high, and after husband asked her multiple times, pt revealed she had shaken the bottle of Tylenol to her mouth to ingest an unknown number of pills. Husband found empty tylenol bottle at home. Husband also noted that they keep a gun next to their bed, and he immediately removed it and locked it in a secure safe. He is a Chief Financial Officer, and there are multiple guns appropriately secured in the household in a safe that only he has access to. He is currently coordinating with his brother to remove all weapons and ammunition from their house while patient is in hospital.    At baseline, he reports  his wife is sharp, articulate, and well-regarded with her work as an Geophysicist/field seismologist vice principal. She gets along well with her colleagues and was even recognized with a party at work for her achievements during the year. She earned two Master's degrees and graduated top of her class. -Aside from school starting, he denies any triggering events and any previous history of similar episodes or abnormal behavior.  -Denies any PMH of psychiatric and medical conditions, inpatient and outpatient treatment, medications. Denies head injury, seizures.  -Reports pt's family history include mother with an unspecified mental illness where "the roles  are reversed: her mother acts like a child and [patient] behaves as the mom" by taking care of whatever pt's mother needs. Potentially her biological father's side has mental disorder, possibly bipolar or schizophrenia but not sure. Denies any family history of suicide.  -Social history: no alcohol in house, pt never drank, smoked, used recreational substances or abused prescriptions. Denies abuse/trauma. States patient had an interesting childhood: She was raised by a couple, and patient believed her father figure was her biological dad. Unfortunately when he was trying to get custody of her, paternity test revealed he was not the father, so patient and sibling were sent to live with grandma.      Past Psychiatric History:  Previous psych diagnoses: Denies Prior inpatient psychiatric treatment: Denies Prior outpatient psychiatric treatment: Denies Current psychiatric provider: Denies   Neuromodulation history: denies   Current therapist: Denies Psychotherapy hx: Denies   History of suicide attempts: Denies History of homicide: Denies   Psychotropic medications: None   Substance Use History: Alcohol: never drinks Tobacco: never smoked, vaped, or chewed tobacco Cannabis (marijuana): never tried Cocaine: never tried Methamphetamines: never tried Psilocybin (mushrooms): never tried Ecstasy (MDMA / molly): never tried LSD (acid): never tried Opiates (fentanyl / heroin): never tried Benzos (Xanax, Klonopin): never tried IV drug use: Never Prescribed meds abuse: Never   History of detox: Never History of rehab: Never   Is the patient at risk to self? Yes Has the patient been a risk to self in the past 6 months? Yes Has the patient been a risk to self within the distant past? No Is the patient a risk to others? Yes Has the patient been a risk to others in the past 6 months? Yes Has the patient been a risk to others within the distant past? No   Alcohol Screening: 1. How often do  you have a drink containing alcohol?: Never 2. How many drinks containing alcohol do you have on a typical day when you are drinking?: n/a 3. How often do you have six or more drinks on one occasion?: Never AUDIT-C Score: 0   Substance Abuse History in the last 12 months: No   Allergies: endorses the following medication allergies with resulting symptoms: Penicillin   Past Medical/Surgical History:  Medical Diagnoses: None Home Rx: None Prior Hosp: None Prior Surgeries / non-head trauma: None   Head trauma: denies LOC: denies Concussions: denies Seizures: denies   Last menstrual period and contraceptives:    Family History:  Medical: Possibly father with diabetes Psych: Mother - "in her own world", disabled Psych Rx: Unknown Suicide: Unknown, denies Homicide: Denies Substance use family hx: Denies   Social History:  Place of birth and grew up where: [place], [place]; grew up in [place] Abuse: no history of abuse Marital Status: Married to Ailey for 15 years. Two children together. Raising godson for 8 years Sexual orientation: straight Children: 2 with current  partner. 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 See collateral provided in HPI by husband for further details Pills stockpile: Denies  Current Medications: Current Facility-Administered Medications  Medication Dose Route Frequency Provider Last Rate Last Admin   acetaminophen (TYLENOL) tablet 650 mg  650 mg Oral Q6H PRN Starkes-Perry, Juel Burrow, FNP       alum & mag hydroxide-simeth (MAALOX/MYLANTA) 200-200-20 MG/5ML suspension 30 mL  30 mL Oral Q4H PRN Starkes-Perry, Juel Burrow, FNP       ARIPiprazole (ABILIFY) tablet 5 mg  5 mg  Oral Daily Rex Kras, MD   5 mg at 01/22/23 1209   diphenhydrAMINE (BENADRYL) capsule 50 mg  50 mg Oral TID PRN Maryagnes Amos, FNP       Or   diphenhydrAMINE (BENADRYL) injection 50 mg  50 mg Intramuscular TID PRN Starkes-Perry, Juel Burrow, FNP       feeding supplement (ENSURE ENLIVE / ENSURE PLUS) liquid 237 mL  237 mL Oral BID BM Attiah, Nadir, MD       haloperidol (HALDOL) tablet 5 mg  5 mg Oral TID PRN Starkes-Perry, Juel Burrow, FNP       Or   haloperidol lactate (HALDOL) injection 5 mg  5 mg Intramuscular TID PRN Starkes-Perry, Juel Burrow, FNP       ibuprofen (ADVIL) tablet 400 mg  400 mg Oral Q6H PRN Starkes-Perry, Juel Burrow, FNP       LORazepam (ATIVAN) tablet 2 mg  2 mg Oral TID PRN Starkes-Perry, Juel Burrow, FNP       Or   LORazepam (ATIVAN) injection 2 mg  2 mg Intramuscular TID PRN Starkes-Perry, Juel Burrow, FNP       magnesium hydroxide (MILK OF MAGNESIA) suspension 30 mL  30 mL Oral Daily PRN Starkes-Perry, Juel Burrow, FNP       melatonin tablet 3 mg  3 mg Oral QHS PRN Starkes-Perry, Juel Burrow, FNP       potassium chloride (KLOR-CON) packet 20 mEq  20 mEq Oral BID Rex Kras, MD       sertraline (ZOLOFT) tablet 25 mg  25 mg Oral Daily Rex Kras, MD   25 mg at 01/22/23 1208    Lab Results: No results found for this or any previous visit (from the past 48 hour(s)).  Blood Alcohol level:  Lab Results  Component Value Date   ETH <10 01/17/2023    Metabolic Labs: No results found for: "HGBA1C", "MPG" No results found for: "PROLACTIN" No results found for: "CHOL", "TRIG", "HDL", "CHOLHDL", "VLDL", "LDLCALC"  Physical Findings:  Psychiatric Specialty Exam: Physical Exam Constitutional:      General: She is not in acute distress.    Appearance: She is not ill-appearing or toxic-appearing.  HENT:     Head: Normocephalic.  Pulmonary:     Effort: Pulmonary effort is normal.  Skin:    Capillary Refill: Capillary refill takes less than 2 seconds.     Comments: Scars  from cutting on neck and wrists healing well  Neurological:     General: No focal deficit present.     Mental Status: She is oriented to person, place, and time.     Review of Systems  Respiratory:  Negative for shortness of breath.  Genitourinary: Negative.   Neurological:  Negative for dizziness and headaches.    Blood pressure 127/86, pulse 83, temperature 99.2 F (37.3 C), temperature source Oral, resp. rate 16, height 5\' 7"  (1.702 m), weight 79.9 kg, SpO2 98%.Body mass index is 27.6 kg/m.  General Appearance: Fairly Groomed and Guarded  Eye Contact:  Minimal  Speech:  Blocked  Volume:  Decreased  Mood:   "fine"  Affect:  Blunt  Thought Process:  Linear  Orientation:  Declined to answer  Thought Content:  Delusions and Ideas of Reference:   Paranoia  Suicidal Thoughts:  No  Homicidal Thoughts:  No  Memory:  Immediate;   NA Recent;   Poor Remote;   Fair  Judgement:  Impaired  Insight:  Lacking  Psychomotor Activity:  NA  Concentration:  Concentration: Fair  Fund of Knowledge:  Fair  Language:  Good  Akathisia:  No  Handed:  Right  AIMS (if indicated):     Assets:  Desire for Improvement Housing Resilience Social Support Talents/Skills  ADL's:  Intact  Cognition:  WNL  Sleep:  Number of Hours: 10.25    Assets  Assets: Medical laboratory scientific officer Plan Summary: Daily contact with patient to assess and evaluate symptoms and progress in treatment and Medication management  Diagnoses / Active Problems: MDD (major depressive disorder), recurrent episode, severe (HCC) Principal Problem:   MDD (major depressive disorder), recurrent episode, severe (HCC) Active Problems:   Suicidal ideations   Acute paranoia (HCC)   ASSESSMENT: Carolyn Salas is a 47 y.o., female with no past psychiatric history who presents to the [facility] Voluntary from [facility] Emergency Department for evaluation and management of suicidal thoughts, recent  intentional acetaminophen overdose, and paranoia.   PLAN: Safety and Monitoring:             -- Voluntary admission to inpatient psychiatric unit for safety, stabilization and treatment             -- Daily contact with patient to assess and evaluate symptoms and progress in treatment             -- Patient's case to be discussed in multi-disciplinary team meeting             -- Observation Level : q15 minute checks             -- Vital signs: q12 hours             -- Precautions: suicide, elopement, and assault   2. Psychiatric Diagnoses and Treatment:  Acute paranoia with persecutory delusions Major depressive disorder Recent intentional acetaminophen overdose Suicidal ideations with recent suicide attempt by cutting              - Continue Abilify 5 mg daily at bedtime for symptoms of paranoia, delusions.   -Consider increasing dose tomorrow 9/27.   -Consider adding low-dose haldol  -Patient declined labs. f/u EKG, A1c, CMP, vitamin D level             - Continue Zoloft 25 mg x 1 dose. Consider increasing dose. -- The risks/benefits/side-effects/alternatives to this medication were discussed in detail with the patient and time was given for questions. The patient consents to medication trial.  -- Metabolic profile and EKG monitoring obtained while on an atypical antipsychotic (BMI: Lipid Panel: HbgA1c: QTc:) : pending EKG     PRN medications for symptomatic management:              --  start acetaminophen 650 mg every 6 hours as needed for mild to moderate pain, fever, and headaches              -- start bismuth subsalicylate 524 mg oral chewable tablet every 3 hours as needed for diarrhea / loose stools              -- start aluminum-magnesium hydroxide + simethicone 30 mL every 4 hours as needed for heartburn or indigestion              -- start melatonin 3 mg bedtime as needed for insomnia             -- As needed agitation protocol in-place   The  risks/benefits/side-effects/alternatives to the above medication were discussed in detail with the patient and time was given for questions. The patient consents to medication trial. FDA black box warnings, if present, were discussed.   The patient is agreeable with the medication plan, as above. We will monitor the patient's response to pharmacologic treatment, and adjust medications as necessary.   3. Routine and other pertinent labs: EKG monitoring: QTc: EKG ordered on 9/24   Transaminitis, resolving Hypokalemia, resolving Previous CMP improving K 3.4 L, calcium 8.8 L, low albumin 3.4. (correected ca 8.9), elevated total bili downtrending.           - Schedule potassium chloride supplement. However, pt has declined. Education on potassium and risks/benefits discussed with pt. Will continue to encourage. - f/u CMP, lipid panel, A1c - monitor LFTs, electrolytes and replete PRN.   4. Group Therapy:             -- Encouraged patient to participate in unit milieu and in scheduled group therapies              -- Short Term Goals: Ability to identify changes in lifestyle to reduce recurrence of condition, verbalize feelings, identify and develop effective coping behaviors, maintain clinical measurements within normal limits, and identify triggers associated with substance abuse/mental health issues will improve. Improvement in ability to disclose and discuss suicidal ideas, demonstrate self-control, and comply with prescribed medications.             -- Long Term Goals: Improvement in symptoms so as ready for discharge -- Patient is encouraged to participate in group therapy while admitted to the psychiatric unit. -- We will address other chronic and acute stressors, which contributed to the patient's MDD (major depressive disorder), recurrent episode, severe (HCC) in order to reduce the risk of self-harm at discharge.   5. Discharge Planning:              -- Social work and case management to  assist with discharge planning and identification of hospital follow-up needs prior to discharge             -- Estimated LOS: 5-7 days             -- Discharge Concerns: Need to establish a safety plan; Medication compliance and effectiveness             -- Discharge Goals: Return home with outpatient referrals for mental health follow-up including medication management/psychotherapy  I certify that inpatient services furnished can reasonably be expected to improve the patient's condition.   Signed: Milbert Coulter, Medical Student 01/22/2023, 4:21 PM

## 2023-01-22 NOTE — Plan of Care (Signed)
?  Problem: Education: ?Goal: Mental status will improve ?Outcome: Progressing ?Goal: Verbalization of understanding the information provided will improve ?Outcome: Progressing ?  ?

## 2023-01-22 NOTE — Progress Notes (Addendum)
D. Pt laying in bed upon initial approach- avoidant eye contact, guarded and suspicious during interaction- voiced no complaints , but did state that she felt like she was being "watched". Pt denied SI/HI and A/VH, and did not appear to be responding to internal stimuli. Pt remained isolative to room today- did not attend groups but did go down for lunch. Pt refused K+ this am, but did agree to take Abilify and sertraline after being provided with info. pamphlets and educated on meds. A. Labs and vitals monitored. Pt given and educated on medications. Pt supported emotionally and encouraged to express concerns and ask questions.   R. Pt remains safe with 15 minute checks. Will continue POC.    01/22/23 1300  Psych Admission Type (Psych Patients Only)  Admission Status Voluntary  Psychosocial Assessment  Patient Complaints Isolation;Suspiciousness  Eye Contact Avoids  Facial Expression Flat  Affect Sad  Speech Logical/coherent  Interaction Cautious;Minimal  Motor Activity Slow  Appearance/Hygiene Disheveled  Behavior Characteristics Irritable;Guarded  Mood Depressed  Thought Process  Coherency WDL  Content WDL  Delusions None reported or observed  Perception WDL  Hallucination None reported or observed  Judgment Impaired  Confusion None  Danger to Self  Current suicidal ideation? Denies  Agreement Not to Harm Self Yes  Description of Agreement agreed to contact staff before acting on harmful thoughts  Danger to Others  Danger to Others None reported or observed  Danger to Others Abnormal  Harmful Behavior to others No threats or harm toward other people

## 2023-01-22 NOTE — Plan of Care (Signed)
Problem: Education: Goal: Emotional status will improve Outcome: Not Progressing Goal: Mental status will improve Outcome: Not Progressing   Problem: Activity: Goal: Interest or engagement in activities will improve Outcome: Not Progressing

## 2023-01-22 NOTE — [facility] Group Notes (Signed)
Adult Psychoeducational Group Note  Date:  01/22/2023 Time:  9:19 PM  Group Topic/Focus:  Wrap-Up Group:   The focus of this group is to help patients review their daily goal of treatment and discuss progress on daily workbooks.  Participation Level:  Minimal  Participation Quality:  Appropriate  Affect:  Appropriate  Cognitive:  Appropriate  Insight: Appropriate  Engagement in Group:  Limited  Modes of Intervention:  Discussion and Support  Additional Comments:  Pt attended the evening group, but responded minimally  to discussion prompts from the Writer. Pt shared that today was an "okay" day on the unit, the highlight of which was catching up on her rest. On the subject of ways to stay well upon discharge, Pt mentioned seeing a therapist on a regular basis. Pt rated her day a 5 out of 10.  Christ Kick 01/22/2023, 9:19 PM

## 2023-01-22 NOTE — Group Note (Signed)
LCSW Group Therapy Note   Group Date: 01/22/2023 Start Time: 1100 End Time: 1200   Type of Therapy and Topic: Group Therapy: Relationship Check-Ins   Participation Level: Did Not Attend   Description Group:  In this group patients are encouraged to rate how well or not well they are able to improve their relationships in Beliefs and Values, Communication, Family and Friends, Actuary and Household, and Intimacy. Patients will be able to discuss and identify what is going well within these aspects and what is not. Patients will be able to find appropriate ways, solutions, and skills that will help them within the selected relationships category. Patients will be encouraged to share and reflect on why things within their relationships are not going so well and get feedback from the instructor or their peers.  This group will be solution focused and process-oriented with patients' participation in sharing and listening to their own and peers experience; along with receive support and advice on how to improve or change the circumstance that is known to be challenging in their relationships category (Beliefs and Values, Communication, Family and Friends, Actuary and Household, and Intimacy).   Therapeutic Goals:  Patient will identify their strengths and weakness within their Beliefs and Values, Communication, Family and Friends, Actuary and Household, and Intimacy relationships. Patient will identify reasons why and how they can improve their relationships.  Patient will identify how they can be supportive and honest to themselves and in their relationships.  Patient will be able to gain support and give support to others with similar challenges.   Summary of Patient Progress  Did not attend group  Therapeutic Modalities:  Solution Focused Therapy Cognitive Behavioral Therapy  Psychodynamic Therapy  Dialectical Behavior Therapy   Carolyn Tampico, LCSW 01/22/2023  12:45 PM

## 2023-01-23 LAB — COMPREHENSIVE METABOLIC PANEL
ALT: 204 U/L — ABNORMAL HIGH (ref 0–44)
AST: 33 U/L (ref 15–41)
Albumin: 3.7 g/dL (ref 3.5–5.0)
Alkaline Phosphatase: 54 U/L (ref 38–126)
Anion gap: 9 (ref 5–15)
BUN: 13 mg/dL (ref 6–20)
CO2: 27 mmol/L (ref 22–32)
Calcium: 8.8 mg/dL — ABNORMAL LOW (ref 8.9–10.3)
Chloride: 101 mmol/L (ref 98–111)
Creatinine, Ser: 0.74 mg/dL (ref 0.44–1.00)
GFR, Estimated: >60 mL/min (ref >60)
Glucose, Bld: 82 mg/dL (ref 70–99)
Potassium: 3.6 mmol/L (ref 3.5–5.1)
Sodium: 137 mmol/L (ref 135–145)
Total Bilirubin: 1.3 mg/dL — ABNORMAL HIGH (ref 0.3–1.2)
Total Protein: 7.4 g/dL (ref 6.5–8.1)

## 2023-01-23 LAB — LIPID PANEL
Cholesterol: 129 mg/dL (ref 0–200)
HDL: 62 mg/dL (ref >40)
LDL Cholesterol: 57 mg/dL (ref 0–99)
Total CHOL/HDL Ratio: 2.1 {ratio}
Triglycerides: 49 mg/dL (ref <150)
VLDL: 10 mg/dL (ref 0–40)

## 2023-01-23 LAB — HEMOGLOBIN A1C
Hgb A1c MFr Bld: 5.4 % (ref 4.8–5.6)
Mean Plasma Glucose: 108.28 mg/dL

## 2023-01-23 MED ORDER — ARIPIPRAZOLE 5 MG PO TABS
5.0000 mg | ORAL_TABLET | Freq: Every day | ORAL | Status: DC
  Filled 2023-01-23 (×2): qty 1

## 2023-01-23 MED ORDER — SERTRALINE HCL 50 MG PO TABS
50.0000 mg | ORAL_TABLET | Freq: Every day | ORAL | Status: DC
  Filled 2023-01-23 (×3): qty 1

## 2023-01-23 NOTE — Progress Notes (Addendum)
[facility] MD Progress Note  01/23/2023 4:00 PM Carolyn Salas  MRN:  [id number]  Principal Problem: MDD (major depressive disorder), recurrent episode, severe (HCC) Diagnosis: Principal Problem:   MDD (major depressive disorder), recurrent episode, severe (HCC) Active Problems:   Suicidal ideations   Acute paranoia (HCC)   Reason for Admission:  Carolyn Salas is a 47 y.o., female with no past psychiatric history who presented to the [facility] voluntary from [facility] Emergency Department for evaluation and management of suicidal thoughts, recent attempt to kill herself by taking an overdose of acetaminophen cutting herself. Patient feels overwhelmed and reports a 3-4 week history of paranoia with persecutory delusions. (admitted on 01/19/2023, total  LOS: 4 days ).   Yesterday the psychiatry team made the following recommendations:    - Continue Abilify 5 mg daily at bedtime for symptoms of paranoia, delusions.                         - Consider increasing dose tomorrow 9/27.                         - Consider adding low-dose haldol  - F/U labs             - Continue Zoloft 25 mg. Consider increasing dose.  The patient's chart was reviewed and nursing notes were reviewed. The patient's case was discussed in multidisciplinary team meeting.   - Overnight events to report per chart review / staff report:   Patient initially refused vital sign checks and having labs drawn this morning.  This morning patient fell while waiting in line to get breakfast. Patient reportedly did not lose consciousness and stood up right away, saying "I'm ok." Patient refused to use wheelchair, continued to get her breakfast. She walked with staff back to the unit, V/S while seated: afebrile, pulse 89, BP 113/74. Pt told nurse afterwards she had felt dizzy earlier during the blood draw and experienced a burning sensation at site of blood draw due to "whatever stuff they put in me." Pt refused repeat V/S  and insisted on returning to bed.   - This morning: On assessment today, patient was laying down in bed and refused to have vitals rechecked. She complied with physical exam and answered questions after more encouragement and explanation that she needed to be evaluated after her fall. She said she felt dizzy during her blood draw and while on the way to the cafeteria. She also said she felt a "burning during the blood draw that went down left arm and chest and my whole body." Pt insisted that something was injected into her while labs were taken, and it made her feel hot and sweaty. Discussed with patient that dizziness and fall may have happened not eating or hydrating enough, the blood draw, increased bedrest, changing positions from laying down to standing up too quickly, or a combination of factors.  Vitals checked while examiner was in the room: pulse 87, BP 119/66. Patient denied headache, dizziness, sweatiness, feeling burning, chest pain, shortness of breath.  Today she denies depression, appetite changes, decreased interest in pleasurable activities, decreased energy, sleep changes. She reports that their mood is "fine" and "there is nothing wrong, however, she has been withdrawn, irritable, and not responding to staff member requests and questions most of the day. Patient reports visits from husband have been fine, but husband reports that she does not make eye  contact and that her body is turned away from him throughout their conversations. Patient says that she has no anxiety and is not depressed and that she has no problems with her mood. Patient is suspicious of staff and does not follow commands without multiple prompts, encouragement, and justification/explanation. Sleep is documented as 8 hours Patient reports her appetite is fine and sometimes describes eating many different things during her meal, however, staff reports she does not eat very much. Concentration is fair. Energy level is  decreased, patient spends most of the time in bed and only leaves room a few times a day for meals. Denies suicidal thoughts. Denies suicidal intent and plan.  Denies having any HI.  Denies having psychotic symptoms.     - Patient received all scheduled medications except: scheduled potassium chloride supplement ("I will not take it if it's not required") and Zoloft 25 mg. Since patient did not received Zoloft this morning, plan to increase to 50 mg and administer at nighttime with Abilify 5 mg. - Patient did not receive any PRN medications Denies having side effects to current psychiatric medications.    We discussed changes to current medication regimen, including increasing zoloft from 25 mg to 50 mg daily, consider FGA if vitals stable   -------------------------------- 9/26 Collateral information obtained Carolyn Salas, patient's husband) Patient granted permission to speak to contact person without restrictions. During this conversation, I obtained further information regarding patient's history.  Husband reports he has been visiting his wife during visiting hours each night and feels like her paranoia has not improved. He reports pt has poor eye contact and faces away from him during their visits. He also believes she has some memory issues or may be concealing information. For example, his wife is usually particular with her clothing and only bathes with shower shoes on; pt did not remember him dropping off shower shoes and did not say she has been using them. He also noticed she does not remember what she had eaten during meals that same day. Also discussed with husband that patient refused labs previous day.  History of Present Illness:  Carolyn Salas is a 47 y.o., female with no past psychiatric history who presents to the [facility] Voluntary from [facility] Emergency Department for evaluation and management of suicidal thoughts, recent intentional acetaminophen  overdose, and paranoia.   HPI: The patient was brought to [facility] from home by her husband on 9/21 after an intentional acetaminophen overdose on 9/18 and for intentionally cutting her neck and arms to inflict self-harm.   This morning pt reports she is in the hospital because she feels paranoid. Since August she feels like she is being watched and listened to, and she feels this is out of her control and being done by some unknown source with an unknown goal. She feels like this is interfering with her life and feels this source is speaking to her through "other [people] always having staged conversations" and sending her messages about her mistakes related to work to make her feel apologetic. She acknowledges she makes mistakes because she is human, and she is not worried about the messages in relation to herself because she is not causing other people's discomfort. States paranoia is unchanged in intensity, frequency since it began in August, and it is not alleviated or worsened by any factors. Because of this huge change in her life with work and feeling watched, she wanted to go to sleep and not wake up. Denies seeing  or hearing the entity that is watching/listening to her. 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 She does not know the time course of being fired or if she will be dismissed.    Today pt reports feeling guilty, having decreased appetite by choice, and having some thoughts of wanting to go to sleep and not wake up due to the overwhelm of her whole life changing. Denies suicidal plan and intent. Denies HI, AVH. Pt denies feeling depressed, having problems with sleep, energy, restlessness. Denies symptoms of mania including rapid or fast speech, increased impulsivity or grandiosity, concentration issues.  Denies trauma, abuse. Denies OCD symptoms.   Mode of transport to Hospital: family car via husband Current Outpatient (Home) Medication  List: No prescriptions, otc, supplements PRN medication prior to evaluation: None   Chart review: On chart review, prior to this evaluation, patient intentionally took 50 tablets of regular strength Tylenol in a self-harm attempt. She presented to [facility] emergency department by husband from home on 01/17/2023 for intentional acetaminophen overdose, complaint of suicidal ideation. Per chart review husband reported pt also had superficial cuts to neck and wrists. Pt reported nausea/vomiting after taking acetaminophen, but was asymptomatic upon presentation to the ED. Patient vital signs in ED: afebrile, HR 79-90s, BP 130s-150 systolic, oxygen saturation 100% on room air.    Upon admission: Labs showed significantly elevated LFTS: AST 1204, ALT 1694. Normal T. bili and alkaline phosphatase. Normal acetaminophen level. Negative alcohol level. INR. Normal ammonia. Normal CBC. Urine drug screen negative. CT abdomen with IV contrast showed no acute GI changes.  Poison control consulted: recommended NAC, which was initiated. Patient received LR. CT abdomen/pelvis: no acute abdominal process   Pt admitted for further evaluation of intentional acetaminophen overdose in the setting of SI complicated by acute transaminitis, hypokalemia. NAC revealed resolving acute transaminitis with liver enzymes trending down.   At [facility], pt reported no history of mental illness or substance abuse. Reported attempted suicide due to poor decisions. Reported severe depression characterized by recurrent suicidal thoughts, excessive sleep, poor appetite, lack of motivation, paranoia and low energy level. She denied drugs, alcohol use, homicidal thoughts, anxiety and psychosis .   Discharge recommendations: check LFTs in one week (9/28) Poison control signed off, recommended to discontinue NAC, cleared to transfer to [facility] when pt stable.     9/24 Collateral information obtained Carolyn Salas, patient's husband) Patient  granted permission to speak to contact person without restrictions. During this conversation, I obtained further information regarding patient's history.  Patient's husband recounts that patient's symptoms started about halfway through the second of week of school  (approximately 12/31/22), with patient started to become agitated. Told husband she "felt set up for failure" by her boss. She reported her boss how she felt, asked for feedback, and was reassured that she was doing well at work. Patient went up chain of command to next boss and HR because she felt paranoid, frustrated, and not heard. She was again reassured there were no problems at work. According to husband pt felt her bosses were part of a conspiracy to get her out of work. Next, she started talking about feeling followed, people were listening to her conversations, and that technology at school and at home were bugged or modified.    Patient took a leave of absence at work, and in 1.5 weeks her symptoms of paranoia was constant and continued to worsen. She became increasingly worried about her children  leaving for school, became uncharacteristically and increasingly affectionate with the kids and hugged them before they left the house. Because of fear the phone was bugged and because she was receiving increased calls from concerned colleagues, patient changed her phone and phone number. When new phone had issues getting set up, pt attributed tech problems to the organization spying on them. Husband reports patient had decreased sleep, decreased appetite for the past week, decreased energy (pt feeling tired, staying in bed), and increased restlessness (leg shaking often). Denies rapid speech, problems in communicating clearly or speaking illogically, risky behavior, grandiosity, auditory or visual hallucinations.   On 9/18 patient told husband she had a headache and said she took a couple of Tylenol. She was up all night vomiting. Two days later,  pt was agitated in the evening and had a conversation with her husband that she felt they would not be watched or listened in on anymore if she resigned from her job. Eventually she went to bed before her husband, who saw her sleeping soundly.  Following morning (on 9/21) patient noticed some blood on comforter. Asked pt if she had a nosebleed, and she covered herself with the sheets. Husband pulled away the sheets to see patient had cut her neck and arms, and he rushed her to the hospital. In the ED her liver enzymes were abnormally high, and after husband asked her multiple times, pt revealed she had shaken the bottle of Tylenol to her mouth to ingest an unknown number of pills. Husband found empty tylenol bottle at home. Husband also noted that they keep a gun next to their bed, and he immediately removed it and locked it in a secure safe. He is a Chief Financial Officer, and there are multiple guns appropriately secured in the household in a safe that only he has access to. He is currently coordinating with his brother to remove all weapons and ammunition from their house while patient is in hospital.    At baseline, he reports his wife is sharp, articulate, and well-regarded with her work as an Geophysicist/field seismologist vice principal. She gets along well with her colleagues and was even recognized with a party at work for her achievements during the year. She earned two Master's degrees and graduated top of her class. -Aside from school starting, he denies any triggering events and any previous history of similar episodes or abnormal behavior.  -Denies any PMH of psychiatric and medical conditions, inpatient and outpatient treatment, medications. Denies head injury, seizures.  -Reports pt's family history include mother with an unspecified mental illness where "the roles are reversed: her mother acts like a child and [patient] behaves as the mom" by taking care of whatever pt's mother needs. Potentially her biological  father's side has mental disorder, possibly bipolar or schizophrenia but not sure. Denies any family history of suicide.  -Social history: no alcohol in house, pt never drank, smoked, used recreational substances or abused prescriptions. Denies abuse/trauma. States patient had an interesting childhood: She was raised by a couple, and patient believed her father figure was her biological dad. Unfortunately when he was trying to get custody of her, paternity test revealed he was not the father, so patient and sibling were sent to live with grandma.      Past Psychiatric History:  Previous psych diagnoses: Denies Prior inpatient psychiatric treatment: Denies Prior outpatient psychiatric treatment: Denies Current psychiatric provider: Denies   Neuromodulation history: denies   Current therapist: Denies Psychotherapy hx: Denies   History of suicide  attempts: Denies History of homicide: Denies   Psychotropic medications: None   Substance Use History: Alcohol: never drinks Tobacco: never smoked, vaped, or chewed tobacco Cannabis (marijuana): never tried Cocaine: never tried Methamphetamines: never tried Psilocybin (mushrooms): never tried Ecstasy (MDMA / molly): never tried LSD (acid): never tried Opiates (fentanyl / heroin): never tried Benzos (Xanax, Klonopin): never tried IV drug use: Never Prescribed meds abuse: Never   History of detox: Never History of rehab: Never   Is the patient at risk to self? Yes Has the patient been a risk to self in the past 6 months? Yes Has the patient been a risk to self within the distant past? No Is the patient a risk to others? Yes Has the patient been a risk to others in the past 6 months? Yes Has the patient been a risk to others within the distant past? No   Alcohol Screening: 1. How often do you have a drink containing alcohol?: Never 2. How many drinks containing alcohol do you have on a typical day when you are drinking?: n/a 3. How  often do you have six or more drinks on one occasion?: Never AUDIT-C Score: 0   Substance Abuse History in the last 12 months: No   Allergies: endorses the following medication allergies with resulting symptoms: Penicillin   Past Medical/Surgical History:  Medical Diagnoses: None Home Rx: None Prior Hosp: None Prior Surgeries / non-head trauma: None   Head trauma: denies LOC: denies Concussions: denies Seizures: denies   Last menstrual period and contraceptives:    Family History:  Medical: Possibly father with diabetes Psych: Mother - "in her own world", disabled Psych Rx: Unknown Suicide: Unknown, denies Homicide: Denies Substance use family hx: Denies   Social History:  Place of birth and grew up where: [place], [place]; grew up in [place] Abuse: no history of abuse Marital Status: Married to [place] for 15 years. Two children together. Raising godson for 8 years Sexual orientation: straight Children: 2 with current partner. 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 See collateral provided in HPI by husband for further details Pills stockpile: Denies  Current Medications: Current Facility-Administered Medications  Medication Dose Route Frequency Provider Last Rate Last Admin   acetaminophen (TYLENOL) tablet 650 mg  650 mg Oral Q6H PRN Starkes-Perry, Juel Burrow, FNP       alum & mag hydroxide-simeth (MAALOX/MYLANTA) 200-200-20 MG/5ML suspension 30 mL  30 mL Oral Q4H PRN Starkes-Perry, Juel Burrow, FNP       ARIPiprazole (ABILIFY) tablet 5 mg  5 mg Oral QHS Goli, Veeraindar, MD       diphenhydrAMINE (BENADRYL) capsule 50 mg  50 mg Oral TID PRN Maryagnes Amos, FNP       Or    diphenhydrAMINE (BENADRYL) injection 50 mg  50 mg Intramuscular TID PRN Starkes-Perry, Juel Burrow, FNP       feeding supplement (ENSURE ENLIVE / ENSURE PLUS) liquid 237 mL  237 mL Oral BID BM Attiah, Nadir, MD       haloperidol (HALDOL) tablet 5 mg  5 mg Oral TID PRN Maryagnes Amos, FNP       Or   haloperidol lactate (HALDOL) injection 5 mg  5 mg Intramuscular TID PRN Rosario Adie, Juel Burrow, FNP  ibuprofen (ADVIL) tablet 400 mg  400 mg Oral Q6H PRN Starkes-Perry, Juel Burrow, FNP       LORazepam (ATIVAN) tablet 2 mg  2 mg Oral TID PRN Rosario Adie, Juel Burrow, FNP       Or   LORazepam (ATIVAN) injection 2 mg  2 mg Intramuscular TID PRN Starkes-Perry, Juel Burrow, FNP       magnesium hydroxide (MILK OF MAGNESIA) suspension 30 mL  30 mL Oral Daily PRN Starkes-Perry, Juel Burrow, FNP       melatonin tablet 3 mg  3 mg Oral QHS PRN Starkes-Perry, Juel Burrow, FNP       potassium chloride (KLOR-CON) packet 20 mEq  20 mEq Oral BID Rex Kras, MD       sertraline (ZOLOFT) tablet 50 mg  50 mg Oral QHS Rex Kras, MD        Lab Results:  Results for orders placed or performed during the hospital encounter of 01/19/23 (from the past 48 hour(s))  Comprehensive metabolic panel     Status: Abnormal   Collection Time: 01/23/23  6:48 AM  Result Value Ref Range   Sodium 137 135 - 145 mmol/L   Potassium 3.6 3.5 - 5.1 mmol/L   Chloride 101 98 - 111 mmol/L   CO2 27 22 - 32 mmol/L   Glucose, Bld 82 70 - 99 mg/dL    Comment: Glucose reference range applies only to samples taken after fasting for at least 8 hours.   BUN 13 6 - 20 mg/dL   Creatinine, Ser 0.98 0.44 - 1.00 mg/dL   Calcium 8.8 (L) 8.9 - 10.3 mg/dL   Total Protein 7.4 6.5 - 8.1 g/dL   Albumin 3.7 3.5 - 5.0 g/dL   AST 33 15 - 41 U/L   ALT 204 (H) 0 - 44 U/L   Alkaline Phosphatase 54 38 - 126 U/L   Total Bilirubin 1.3 (H) 0.3 - 1.2 mg/dL   GFR, Estimated >11 >91 mL/min    Comment: (NOTE) Calculated using the CKD-EPI Creatinine Equation  (2021)    Anion gap 9 5 - 15    Comment: Performed at [facility], [address], [place], [place] [address]  Hemoglobin A1c     Status: None   Collection Time: 01/23/23  6:48 AM  Result Value Ref Range   Hgb A1c MFr Bld 5.4 4.8 - 5.6 %    Comment: (NOTE) Pre diabetes:          5.7%-6.4%  Diabetes:              >6.4%  Glycemic control for   <7.0% adults with diabetes    Mean Plasma Glucose 108.28 mg/dL    Comment: Performed at [facility], [address] [place], [place] [address]  Lipid panel     Status: None   Collection Time: 01/23/23  6:48 AM  Result Value Ref Range   Cholesterol 129 0 - 200 mg/dL   Triglycerides 49 <086 mg/dL   HDL 62 >57 mg/dL   Total CHOL/HDL Ratio 2.1 RATIO   VLDL 10 0 - 40 mg/dL   LDL Cholesterol 57 0 - 99 mg/dL    Comment:        Total Cholesterol/HDL:CHD Risk Coronary Heart Disease Risk Table                     Men   Women  1/2 Average Risk   3.4   3.3  Average  Risk       5.0   4.4  2 X Average Risk   9.6   7.1  3 X Average Risk  23.4   11.0        Use the calculated Patient Ratio above and the CHD Risk Table to determine the patient's CHD Risk.        ATP III CLASSIFICATION (LDL):  <100     mg/dL   Optimal  782-956  mg/dL   Near or Above                    Optimal  130-159  mg/dL   Borderline  213-086  mg/dL   High  >578     mg/dL   Very High Performed at [facility], [address] [place], [place] [address]     Blood Alcohol level:  Lab Results  Component Value Date   ETH <10 01/17/2023    Metabolic Labs: Lab Results  Component Value Date   HGBA1C 5.4 01/23/2023   MPG 108.28 01/23/2023   No results found for: "PROLACTIN" Lab Results  Component Value Date   CHOL 129 01/23/2023   TRIG 49 01/23/2023   HDL 62 01/23/2023   CHOLHDL 2.1 01/23/2023   VLDL 10 01/23/2023   LDLCALC 57 01/23/2023    Physical Findings:  Psychiatric Specialty Exam: Physical Exam Vitals  reviewed.  Constitutional:      General: She is not in acute distress.    Appearance: She is not ill-appearing or toxic-appearing.  HENT:     Head: Normocephalic.  Cardiovascular:     Rate and Rhythm: Regular rhythm. Tachycardia present.     Pulses: Normal pulses.     Heart sounds: Normal heart sounds.  Pulmonary:     Effort: Pulmonary effort is normal.     Breath sounds: Normal breath sounds.  Musculoskeletal:     Cervical back: Normal range of motion.  Skin:    General: Skin is warm.     Capillary Refill: Capillary refill takes less than 2 seconds.  Neurological:     General: No focal deficit present.     Mental Status: She is alert and oriented to person, place, and time.     Cranial Nerves: Cranial nerves 2-12 are intact. No facial asymmetry.     Sensory: Sensation is intact.     Motor: Motor function is intact. No weakness or abnormal muscle tone.     Review of Systems  Constitutional:  Negative for appetite change, diaphoresis, fatigue and fever.  Respiratory:  Negative for shortness of breath.   Cardiovascular:  Negative for chest pain.  Genitourinary: Negative.   Neurological:  Negative for dizziness and headaches.    Blood pressure 117/72, pulse 75, temperature 98 F (36.7 C), temperature source Oral, resp. rate 18, height 5\' 7"  (1.702 m), weight 79.9 kg, SpO2 97%.Body mass index is 27.6 kg/m.  General Appearance: Fairly Groomed and Guarded  Eye Contact:  Minimal  Speech:  Blocked  Volume:  Decreased  Mood:   "fine"  Affect:  Blunt  Thought Process:  Linear  Orientation:  Declined to answer  Thought Content:  Delusions and Ideas of Reference:   Paranoia  Suicidal Thoughts:  No  Homicidal Thoughts:  No  Memory:  Immediate;   NA Recent;   Poor Remote;   Fair  Judgement:  Impaired  Insight:  Lacking  Psychomotor Activity:  NA  Concentration:  Concentration: Fair  Fund of Knowledge:  Fair  Language:  Good  Akathisia:  No  Handed:  Right  AIMS (if  indicated):     Assets:  Desire for Improvement Housing Resilience Social Support Talents/Skills  ADL's:  Intact  Cognition:  WNL  Sleep:  Number of Hours: 10.25    Assets  Assets: Communication Skills   Treatment Plan Summary: Daily contact with patient to assess and evaluate symptoms and progress in treatment and Medication management  Diagnoses / Active Problems: MDD (major depressive disorder), recurrent episode, severe (HCC) Principal Problem:   MDD (major depressive disorder), recurrent episode, severe (HCC) Active Problems:   Suicidal ideations   Acute paranoia (HCC)   ASSESSMENT: Carolyn Salas is a 47 y.o., female with no past psychiatric history who [facility] [facility] Voluntary from [facility] Emergency Department for evaluation and management of suicidal thoughts, recent intentional acetaminophen overdose, and paranoia.   PLAN: Safety and Monitoring:             -- Involuntary admission to inpatient psychiatric unit for safety, stabilization and treatment             -- Daily contact with patient to assess and evaluate symptoms and progress in treatment             -- Patient's case to be discussed in multi-disciplinary team meeting             -- Observation Level : q15 minute checks             -- Vital signs: q12 hours             -- Precautions: suicide, elopement, and assault  -- Staff monitoring amount of patient intake during meals  -- Monitor patient taking medications     2. Psychiatric Diagnoses and Treatment:   Acute paranoia with persecutory delusions Major depressive disorder Recent intentional acetaminophen overdose Suicidal ideations with recent suicide attempt by cutting  Patient continues to be paranoid (being watched, conversations monitored, people's conversations "staged"), irritable, withdrawn, and has not made significant improvements yet with regard to her mood and paranoia with Abilify 5 mg and Zoloft 25.  Pt has received only 2 doses of Abilify 5mg , so plan to continue Abilify at thise dose and trial increasing in Zoloft as that has been well-tolerated and may improve patient's mood. If patient's paranoia does not improve or worsens, consider adding additional medication such as an first generation antipsychotic or increase Abilify.   - Increase Zoloft from 25 mg to 50 mg nightly.             - Continue Abilify 5 mg daily at bedtime for symptoms of paranoia, delusions.   -Consider increasing dose or adding additional antipsychotic due to persistent paranoia, and delusions over the weekend. - F/U labs as noted below               -- The risks/benefits/side-effects/alternatives to this medication were discussed in detail with the patient and time was given for questions. The patient consents to medication trial.  -- Metabolic profile and EKG monitoring obtained while on an atypical antipsychotic (BMI: Lipid Panel: HbgA1c: QTc:) : pending EKG     PRN medications for symptomatic management:              -- start acetaminophen 650 mg every 6 hours as needed for mild to moderate pain, fever, and headaches              -- start bismuth  subsalicylate 524 mg oral chewable tablet every 3 hours as needed for diarrhea / loose stools              -- start aluminum-magnesium hydroxide + simethicone 30 mL every 4 hours as needed for heartburn or indigestion              -- start melatonin 3 mg bedtime as needed for insomnia             -- As needed agitation protocol in-place   The risks/benefits/side-effects/alternatives to the above medication were discussed in detail with the patient and time was given for questions. The patient consents to medication trial. FDA black box warnings, if present, were discussed.   The patient is agreeable with the medication plan, as above. We will monitor the patient's response to pharmacologic treatment, and adjust medications as necessary.   3. Routine and other pertinent  labs: EKG monitoring: QTc: EKG ordered on 9/24   Transaminitis, resolving Hypokalemia, resolved Initial CMP at admission showed improving hypokalemia, corrected Ca 8.9, elevated total bili downtrending.   - ALT and total bilirubin elevated but downtrending. AST within normal limits. A1c 5.4.  - Hypokalemia resolved without addition of potassium chloride supplement. - Continue to monitor LFTs, replete electrolytes PRN.  - F/u ammonia level  4. Group Therapy:             -- Encouraged patient to participate in unit milieu and in scheduled group therapies              -- Short Term Goals: Ability to identify changes in lifestyle to reduce recurrence of condition, verbalize feelings, identify and develop effective coping behaviors, maintain clinical measurements within normal limits, and identify triggers associated with substance abuse/mental health issues will improve. Improvement in ability to disclose and discuss suicidal ideas, demonstrate self-control, and comply with prescribed medications.             -- Long Term Goals: Improvement in symptoms so as ready for discharge -- Patient is encouraged to participate in group therapy while admitted to the psychiatric unit. -- We will address other chronic and acute stressors, which contributed to the patient's MDD (major depressive disorder), recurrent episode, severe (HCC) in order to reduce the risk of self-harm at discharge.   5. Discharge Planning:              -- Social work and case management to assist with discharge planning and identification of hospital follow-up needs prior to discharge             -- Estimated LOS: 5-7 days             -- Discharge Concerns: Need to establish a safety plan; Medication compliance and effectiveness             -- Discharge Goals: Return home with outpatient referrals for mental health follow-up including medication management/psychotherapy  I certify that inpatient services furnished can reasonably be  expected to improve the patient's condition.   Signed: Milbert Coulter, Medical Student 01/23/2023, 4:00 PM

## 2023-01-23 NOTE — Progress Notes (Signed)
Patient refused prescribed Zoloft stating, "It makes me sleepy I want it scheduled to be taken at night". Provider notified.

## 2023-01-23 NOTE — Group Note (Signed)
Recreation Therapy Group Note   Group Topic:Leisure Education  Group Date: 01/23/2023 Start Time: 0930 End Time: 1000 Facilitators: Kahlel Peake-McCall, LRT,CTRS Location: [address] Dayroom   Group Topic: Leisure Education   Goal Area(s) Addresses:  Patient will successfully demonstrate knowledge of leisure and recreation interests. Patient will successfully identify benefits of leisure participation.  Patient will verbalize appropriate recreation activities to use post discharge.  Intervention: Music Trivia   Group Description: LRT facilitated a competitive group game to challenge patients knowledge of music. In teams, patients were asked to answer trivia questions that required patients to finish the lyric or guess the artist. The team with the most points at the end of the game won.   Education:  Leisure Education, Publishing copy Outcome: Acknowledges education   Affect/Mood: N/A   Participation Level: Did not attend    Clinical Observations/Individualized Feedback:     Plan: Continue to engage patient in RT group sessions 2-3x/week.   Erlinda Solinger-McCall, LRT,CTRS 01/23/2023 12:30 PM

## 2023-01-23 NOTE — Progress Notes (Addendum)
Patient has been isolative to room and noncompliant with care today. Pt covered her face with blanket when asked questions by this RN and is refusing vital signs post fall as well as morning medications. Pt refused to verbally respond to this RN or allow this RN to assess her. Pt did deny pain related to fall when asked by this RN.    01/23/23 0800  Psych Admission Type (Psych Patients Only)  Admission Status Voluntary  Psychosocial Assessment  Patient Complaints Isolation;Suspiciousness  Eye Contact Avoids  Facial Expression Flat;Sad  Affect Depressed;Flat  Speech Soft  Interaction Avoidant;Isolative;Minimal  Motor Activity Slow;Unsteady  Appearance/Hygiene Disheveled  Behavior Characteristics Unable to participate;Guarded;Irritable  Mood Depressed;Irritable;Sad  Thought Process  Coherency WDL  Content WDL  Delusions WDL  Perception WDL  Hallucination None reported or observed  Judgment Poor  Confusion None  Danger to Self  Current suicidal ideation? Denies  Description of Suicide Plan No plan  Self-Injurious Behavior No self-injurious ideation or behavior indicators observed or expressed   Agreement Not to Harm Self Yes  Description of Agreement Verbal  Danger to Others  Danger to Others None reported or observed  Danger to Others Abnormal  Harmful Behavior to others No threats or harm toward other people

## 2023-01-23 NOTE — Group Note (Deleted)
Date:  01/23/2023 Time:  9:33 PM  Group Topic/Focus:  AA Meeting/Group     Participation Level:  {BHH PARTICIPATION EXBMW:41324}  Participation Quality:  {BHH PARTICIPATION QUALITY:22265}  Affect:  {BHH AFFECT:22266}  Cognitive:  {BHH COGNITIVE:22267}  Insight: {BHH Insight2:20797}  Engagement in Group:  {BHH ENGAGEMENT IN MWNUU:72536}  Modes of Intervention:  {BHH MODES OF INTERVENTION:22269}  Additional Comments:  ***  Kennieth Francois 01/23/2023, 9:33 PM

## 2023-01-23 NOTE — Plan of Care (Signed)
  Problem: Education: Goal: Emotional status will improve Outcome: Progressing Goal: Mental status will improve Outcome: Progressing  Patient refused Potasium PO this evening. Support and encouragement provided. Routine safety checks conducted every 15 minutes. Patient notified to inform staff with problems or concerns.  Patient contracts for safety at this time. Will continue to monitor.

## 2023-01-23 NOTE — Group Note (Signed)
Date:  01/23/2023 Time:  12:07 PM  Group Topic/Focus:   Goals Group:   The focus of this group is to help patients establish daily goals to achieve during treatment and discuss how the patient can incorporate goal setting into their daily lives to aide in recovery.    Participation Level:  Did Not Attend  Participation Quality:      Affect:      Cognitive:      Insight: None  Engagement in Group:      Modes of Intervention:      Additional Comments:     Reymundo Poll 01/23/2023, 12:07 PM

## 2023-01-23 NOTE — Group Note (Signed)
Date:  01/23/2023 Time:  2:39 PM  Group Topic/Focus:  Wellness Toolbox:   The focus of this group is to discuss various aspects of wellness, balancing those aspects and exploring ways to increase the ability to experience wellness.  Patients will create a wellness toolbox for use upon discharge.    Participation Level:  Did Not Attend  Participation Quality:      Affect:      Cognitive:      Insight: None  Engagement in Group:      Modes of Intervention:      Additional Comments:     Reymundo Poll 01/23/2023, 2:39 PM

## 2023-01-23 NOTE — Progress Notes (Signed)
  Patient was in the dinning room in line to get breakfast and fell. Patient quickly got up did not loose conscious stated "I'm ok"  . Wheelchair provided Patient refused continued to get her breakfast. Encouraged Patient to walk to  the unit. Staff walked on the unit with Patient and took V/S. V/S WNL PERRLA denied Pain, stated "They took blood and I felt stinging when they put Staff in me" Patient stated she felt dizzy when she was going to the dinning room and hot. On assessment Patient denies any dizziness. Patient refused another set of V/S refused to stay sited insisted on going to her room to bed. Provider came and one on one with Patient done in the room. Support and encouragement provided. All Fall protocol in place.

## 2023-01-23 NOTE — Plan of Care (Signed)
  Problem: Education: Goal: Emotional status will improve Outcome: Not Progressing Goal: Mental status will improve Outcome: Not Progressing   Problem: Activity: Goal: Interest or engagement in activities will improve Outcome: Not Progressing   

## 2023-01-23 NOTE — Group Note (Signed)
Date:  01/23/2023 Time:  9:47 PM  Group Topic/Focus:  Group Therapy; AA Meeting     Participation Level:  Did Not Attend  Participation Quality:  N/A  Affect:   N/A  Cognitive:   N/A  Insight: None  Engagement in Group:  None  Modes of Intervention:   N/A  Additional Comments:  Patient did not attend AA Meeting.  Carolyn Salas 01/23/2023, 9:47 PM

## 2023-01-24 DIAGNOSIS — F333 Major depressive disorder, recurrent, severe with psychotic symptoms: Secondary | ICD-10-CM

## 2023-01-24 MED ORDER — SERTRALINE HCL 50 MG PO TABS
50.0000 mg | ORAL_TABLET | Freq: Every day | ORAL | Status: DC
  Administered 2023-01-24: 50 mg via ORAL
  Filled 2023-01-24 (×4): qty 1

## 2023-01-24 MED ORDER — ARIPIPRAZOLE 5 MG PO TABS
5.0000 mg | ORAL_TABLET | Freq: Every day | ORAL | Status: DC
  Administered 2023-01-24 – 2023-01-25 (×2): 5 mg via ORAL
  Filled 2023-01-24 (×5): qty 1

## 2023-01-24 NOTE — Progress Notes (Signed)
Pt continues to not participate in activities and is isolative to her room. She refused bedtime medications. She was educated on the importance to take her medications but she continued to refuse and placed her sheets over her head.

## 2023-01-24 NOTE — Progress Notes (Signed)
   01/24/23 2257  Psych Admission Type (Psych Patients Only)  Admission Status Voluntary  Psychosocial Assessment  Patient Complaints Depression;Suspiciousness;Isolation  Eye Contact Brief  Facial Expression Flat  Affect Apprehensive  Speech Logical/coherent  Interaction Guarded  Motor Activity Slow  Appearance/Hygiene Disheveled  Behavior Characteristics Guarded  Mood Depressed;Preoccupied  Thought Process  Coherency WDL  Content WDL  Delusions None reported or observed  Perception WDL  Hallucination None reported or observed  Judgment Poor  Confusion None  Danger to Self  Current suicidal ideation? Denies  Self-Injurious Behavior No self-injurious ideation or behavior indicators observed or expressed   Agreement Not to Harm Self Yes  Description of Agreement verbal  Danger to Others  Danger to Others None reported or observed  Danger to Others Abnormal  Harmful Behavior to others No threats or harm toward other people  Destructive Behavior No threats or harm toward property

## 2023-01-24 NOTE — Group Note (Unsigned)
Date:  01/25/2023 Time:  1:39 AM  Group Topic/Focus:  Wrap-Up Group:   The focus of this group is to help patients review their daily goal of treatment and discuss progress on daily workbooks.    Participation Level:  Minimal  Participation Quality:  Appropriate and Sharing  Affect:  Appropriate  Cognitive:  Appropriate  Insight: Appropriate  Engagement in Group:  Limited  Modes of Intervention:  Activity and Socialization  Additional Comments:  The patient rated her day a 5/10. The patient did not elaborate on how her day was or what made it a 5/10 day. The patient did not participate in the group activity.   Kennieth Francois 01/25/2023, 1:39 AM

## 2023-01-24 NOTE — Progress Notes (Addendum)
D. Pt presented a little more responsive this am, requested a breakfast tray but only ate a very small amount. Pt agreed to take her medication ( Abilify and Zoloft), but stated, "If you want me to go to group, I'm not going to take my meds. They make me tired. I'll take my meds, but I'm not going to go to group. It's either or." This nurse emphasized the importance of taking her medications, and reassured her that if she was drowsy she could stay back from group. Pt remained in her room in bed for most of the shift, did not attend groups, but did go to the dining room for lunch and dinner.  Pt currently denies SI/HI and AVH, but does appear to be thought blocking, and at times, is selectively mute when attempting to converse with her.  A. Labs and vitals monitored. Pt given and educated on medications. Pt supported emotionally and encouraged to express concerns and ask questions.   R. Pt remains safe with 15 minute checks. Will continue POC.

## 2023-01-24 NOTE — Progress Notes (Signed)
[facility] MD Progress Note  01/24/2023 11:27 AM Carolyn Salas  MRN:  [id number]  Principal Problem: MDD (major depressive disorder), recurrent episode, severe (HCC) Diagnosis: Principal Problem:   MDD (major depressive disorder), recurrent episode, severe (HCC) Active Problems:   Suicidal ideations   Acute paranoia (HCC)   Reason for Admission:  Carolyn Salas is a 47 y.o., female with no past psychiatric history who presented to the [facility] voluntary from [facility] Emergency Department for evaluation and management of suicidal thoughts, recent attempt to kill herself by taking an overdose of acetaminophen cutting herself. Patient feels overwhelmed and reports a 3-4 week history of paranoia with persecutory delusions. (admitted on 01/19/2023, total  LOS: 5 days ).   Yesterday the psychiatry team made the following recommendations:    - Continue Abilify 5 mg daily at bedtime for symptoms of paranoia, delusions.                         - Consider increasing dose tomorrow 9/27.                         - Consider adding low-dose haldol  - F/U labs             - Continue Zoloft 25 mg. Consider increasing dose.  The patient's chart was reviewed and nursing notes were reviewed. The patient's case was discussed in multidisciplinary team meeting.   - Overnight events to report per chart review / staff report:    The patient has been noncompliant and isolative.  She has refused to take medications last night.  Staff reports that she is sleeping but not attending any groups.  She has been refusing vital signs and labs and with frequent prompting has been participating partially.   - This morning: On assessment the patient was lying in bed and appeared quite disheveled and withdrawn.  She maintained poor eye contact.  Upon prompting she did get up and sit up on the bed.  She denies any problems and seems to be in denial of all symptoms although she still believes that her house is  bugged, that her phone has been tapped and that she is being monitored even here on the unit.  She denies any auditory or visual hallucinations or delusions but does acknowledge that the people are monitoring her and checking on her.  Overall she appears to be cognitively intact. No further episodes of dizziness.  Her vital signs remained stable with a blood pressure 129/80.  Her pulse is 70. Although today she continues to deny all symptoms, she remains uncooperative and not participating in any groups.  -Patient refused all her medications last night.- Patient did not receive any PRN medications   -------------------------------- 9/26 Collateral information obtained Alphia Moh, patient's husband) Patient granted permission to speak to contact person without restrictions. During this conversation, I obtained further information regarding patient's history.  Husband reports he has been visiting his wife during visiting hours each night and feels like her paranoia has not improved. He reports pt has poor eye contact and faces away from him during their visits. He also believes she has some memory issues or may be concealing information. For example, his wife is usually particular with her clothing and only bathes with shower shoes on; pt did not remember him dropping off shower shoes and did not say she has been using them. He also noticed she does not  remember what she had eaten during meals that same day. Also discussed with husband that patient refused labs previous day. Collateral information from patient's husband dated 01/23/2023: Husband continues to endorse acute paranoia and no significant improvement since admission.  He was informed that patient has been noncompliant with treatment and that we may consider low-dose Haldol on Saturday if she does not improve.  History of Present Illness:  Carolyn Salas is a 47 y.o., female with no past psychiatric history who presents to the [facility] Voluntary from [facility] Emergency Department for evaluation and management of suicidal thoughts, recent intentional acetaminophen overdose, and paranoia.   HPI: The patient was brought to [facility] from home by her husband on 9/21 after an intentional acetaminophen overdose on 9/18 and for intentionally cutting her neck and arms to inflict self-harm.   This morning pt reports she is in the hospital because she feels paranoid. Since August she feels like she is being watched and listened to, and she feels this is out of her control and being done by some unknown source with an unknown goal. She feels like this is interfering with her life and feels this source is speaking to her through "other [people] always having staged conversations" and sending her messages about her mistakes related to work to make her feel apologetic. She acknowledges she makes mistakes because she is human, and she is not worried about the messages in relation to herself because she is not causing other people's discomfort. States paranoia is unchanged in intensity, frequency since it began in August, and it is not alleviated or worsened by any factors. Because of this huge change in her life with work and feeling watched, she wanted to go to sleep and not wake up. Denies seeing or hearing the entity that is watching/listening to her. 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 She does not know the time course of being fired or if she will be dismissed.    Today pt reports feeling guilty, having decreased appetite by choice, and having some thoughts of wanting to go to sleep and not wake up due to the overwhelm of her whole life changing. Denies suicidal plan and intent. Denies HI, AVH. Pt denies feeling depressed, having problems with sleep, energy, restlessness. Denies symptoms of mania including rapid or fast speech, increased impulsivity or grandiosity, concentration  issues.  Denies trauma, abuse. Denies OCD symptoms.   Mode of transport to Hospital: family car via husband Current Outpatient (Home) Medication List: No prescriptions, otc, supplements PRN medication prior to evaluation: None   Chart review: On chart review, prior to this evaluation, patient intentionally took 50 tablets of regular strength Tylenol in a self-harm attempt. She presented to [facility] emergency department by husband from home on 01/17/2023 for intentional acetaminophen overdose, complaint of suicidal ideation. Per chart review husband reported pt also had superficial cuts to neck and wrists. Pt reported nausea/vomiting after taking acetaminophen, but was asymptomatic upon presentation to the ED. Patient vital signs in ED: afebrile, HR 79-90s, BP 130s-150 systolic, oxygen saturation 100% on room air.    Upon admission: Labs showed significantly elevated LFTS: AST 1204, ALT 1694. Normal T. bili and alkaline phosphatase. Normal acetaminophen level. Negative alcohol level. INR. Normal ammonia. Normal CBC. Urine drug screen negative. CT abdomen with IV contrast showed no acute GI changes.  Poison control consulted: recommended NAC, which was initiated. Patient received LR. CT abdomen/pelvis: no acute  abdominal process   Pt admitted for further evaluation of intentional acetaminophen overdose in the setting of SI complicated by acute transaminitis, hypokalemia. NAC revealed resolving acute transaminitis with liver enzymes trending down.   At [facility], pt reported no history of mental illness or substance abuse. Reported attempted suicide due to poor decisions. Reported severe depression characterized by recurrent suicidal thoughts, excessive sleep, poor appetite, lack of motivation, paranoia and low energy level. She denied drugs, alcohol use, homicidal thoughts, anxiety and psychosis .   Discharge recommendations: check LFTs in one week (9/28) Poison control signed off, recommended  to discontinue NAC, cleared to transfer to [facility] when pt stable.     9/24 Collateral information obtained Alphia Moh, patient's husband) Patient granted permission to speak to contact person without restrictions. During this conversation, I obtained further information regarding patient's history.  Patient's husband recounts that patient's symptoms started about halfway through the second of week of school  (approximately 12/31/22), with patient started to become agitated. Told husband she "felt set up for failure" by her boss. She reported her boss how she felt, asked for feedback, and was reassured that she was doing well at work. Patient went up chain of command to next boss and HR because she felt paranoid, frustrated, and not heard. She was again reassured there were no problems at work. According to husband pt felt her bosses were part of a conspiracy to get her out of work. Next, she started talking about feeling followed, people were listening to her conversations, and that technology at school and at home were bugged or modified.    Patient took a leave of absence at work, and in 1.5 weeks her symptoms of paranoia was constant and continued to worsen. She became increasingly worried about her children leaving for school, became uncharacteristically and increasingly affectionate with the kids and hugged them before they left the house. Because of fear the phone was bugged and because she was receiving increased calls from concerned colleagues, patient changed her phone and phone number. When new phone had issues getting set up, pt attributed tech problems to the organization spying on them. Husband reports patient had decreased sleep, decreased appetite for the past week, decreased energy (pt feeling tired, staying in bed), and increased restlessness (leg shaking often). Denies rapid speech, problems in communicating clearly or speaking illogically, risky behavior, grandiosity, auditory or visual  hallucinations.   On 9/18 patient told husband she had a headache and said she took a couple of Tylenol. She was up all night vomiting. Two days later, pt was agitated in the evening and had a conversation with her husband that she felt they would not be watched or listened in on anymore if she resigned from her job. Eventually she went to bed before her husband, who saw her sleeping soundly.  Following morning (on 9/21) patient noticed some blood on comforter. Asked pt if she had a nosebleed, and she covered herself with the sheets. Husband pulled away the sheets to see patient had cut her neck and arms, and he rushed her to the hospital. In the ED her liver enzymes were abnormally high, and after husband asked her multiple times, pt revealed she had shaken the bottle of Tylenol to her mouth to ingest an unknown number of pills. Husband found empty tylenol bottle at home. Husband also noted that they keep a gun next to their bed, and he immediately removed it and locked it in a secure safe. He is a firearms  instructor, and there are multiple guns appropriately secured in the household in a safe that only he has access to. He is currently coordinating with his brother to remove all weapons and ammunition from their house while patient is in hospital.    At baseline, he reports his wife is sharp, articulate, and well-regarded with her work as an Geophysicist/field seismologist vice principal. She gets along well with her colleagues and was even recognized with a party at work for her achievements during the year. She earned two Master's degrees and graduated top of her class. -Aside from school starting, he denies any triggering events and any previous history of similar episodes or abnormal behavior.  -Denies any PMH of psychiatric and medical conditions, inpatient and outpatient treatment, medications. Denies head injury, seizures.  -Reports pt's family history include mother with an unspecified mental illness where "the roles  are reversed: her mother acts like a child and [patient] behaves as the mom" by taking care of whatever pt's mother needs. Potentially her biological father's side has mental disorder, possibly bipolar or schizophrenia but not sure. Denies any family history of suicide.  -Social history: no alcohol in house, pt never drank, smoked, used recreational substances or abused prescriptions. Denies abuse/trauma. States patient had an interesting childhood: She was raised by a couple, and patient believed her father figure was her biological dad. Unfortunately when he was trying to get custody of her, paternity test revealed he was not the father, so patient and sibling were sent to live with grandma.      Past Psychiatric History:  Previous psych diagnoses: Denies Prior inpatient psychiatric treatment: Denies Prior outpatient psychiatric treatment: Denies Current psychiatric provider: Denies   Neuromodulation history: denies   Current therapist: Denies Psychotherapy hx: Denies   History of suicide attempts: Denies History of homicide: Denies   Psychotropic medications: None   Substance Use History: Alcohol: never drinks Tobacco: never smoked, vaped, or chewed tobacco Cannabis (marijuana): never tried Cocaine: never tried Methamphetamines: never tried Psilocybin (mushrooms): never tried Ecstasy (MDMA / molly): never tried LSD (acid): never tried Opiates (fentanyl / heroin): never tried Benzos (Xanax, Klonopin): never tried IV drug use: Never Prescribed meds abuse: Never   History of detox: Never History of rehab: Never   Is the patient at risk to self? Yes Has the patient been a risk to self in the past 6 months? Yes Has the patient been a risk to self within the distant past? No Is the patient a risk to others? Yes Has the patient been a risk to others in the past 6 months? Yes Has the patient been a risk to others within the distant past? No   Alcohol Screening: 1. How often do  you have a drink containing alcohol?: Never 2. How many drinks containing alcohol do you have on a typical day when you are drinking?: n/a 3. How often do you have six or more drinks on one occasion?: Never AUDIT-C Score: 0   Substance Abuse History in the last 12 months: No   Allergies: endorses the following medication allergies with resulting symptoms: Penicillin   Past Medical/Surgical History:  Medical Diagnoses: None Home Rx: None Prior Hosp: None Prior Surgeries / non-head trauma: None   Head trauma: denies LOC: denies Concussions: denies Seizures: denies   Last menstrual period and contraceptives:    Family History:  Medical: Possibly father with diabetes Psych: Mother - "in her own world", disabled Psych Rx: Unknown Suicide: Unknown, denies Homicide: Denies Substance use family  hx: Denies   Social History:  Place of birth and grew up where: [place], [place]; grew up in [place] Abuse: no history of abuse Marital Status: Married to [place] for 15 years. Two children together. Raising godson for 8 years Sexual orientation: straight Children: 2 with current partner. 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 See collateral provided in HPI by husband for further details Pills stockpile: Denies  Current Medications: Current Facility-Administered Medications  Medication Dose Route Frequency Provider Last Rate Last Admin   acetaminophen (TYLENOL) tablet 650 mg  650 mg Oral Q6H PRN Starkes-Perry, Juel Burrow, FNP       alum & mag hydroxide-simeth (MAALOX/MYLANTA) 200-200-20 MG/5ML suspension 30 mL  30 mL Oral Q4H PRN Starkes-Perry, Juel Burrow, FNP       ARIPiprazole (ABILIFY) tablet 5 mg  5 mg  Oral Daily Rex Kras, MD   5 mg at 01/24/23 1104   diphenhydrAMINE (BENADRYL) capsule 50 mg  50 mg Oral TID PRN Maryagnes Amos, FNP       Or   diphenhydrAMINE (BENADRYL) injection 50 mg  50 mg Intramuscular TID PRN Starkes-Perry, Juel Burrow, FNP       feeding supplement (ENSURE ENLIVE / ENSURE PLUS) liquid 237 mL  237 mL Oral BID BM Attiah, Nadir, MD       haloperidol (HALDOL) tablet 5 mg  5 mg Oral TID PRN Starkes-Perry, Juel Burrow, FNP       Or   haloperidol lactate (HALDOL) injection 5 mg  5 mg Intramuscular TID PRN Starkes-Perry, Juel Burrow, FNP       ibuprofen (ADVIL) tablet 400 mg  400 mg Oral Q6H PRN Starkes-Perry, Juel Burrow, FNP       LORazepam (ATIVAN) tablet 2 mg  2 mg Oral TID PRN Starkes-Perry, Juel Burrow, FNP       Or   LORazepam (ATIVAN) injection 2 mg  2 mg Intramuscular TID PRN Starkes-Perry, Juel Burrow, FNP       magnesium hydroxide (MILK OF MAGNESIA) suspension 30 mL  30 mL Oral Daily PRN Starkes-Perry, Juel Burrow, FNP       melatonin tablet 3 mg  3 mg Oral QHS PRN Starkes-Perry, Juel Burrow, FNP       potassium chloride (KLOR-CON) packet 20 mEq  20 mEq Oral BID Rex Kras, MD       sertraline (ZOLOFT) tablet 50 mg  50 mg Oral Daily Rex Kras, MD   50 mg at 01/24/23 1104    Lab Results:  Results for orders placed or performed during the hospital encounter of 01/19/23 (from the past 48 hour(s))  Comprehensive metabolic panel     Status: Abnormal   Collection Time: 01/23/23  6:48 AM  Result Value Ref Range   Sodium 137 135 - 145 mmol/L   Potassium 3.6 3.5 - 5.1 mmol/L   Chloride 101 98 - 111 mmol/L   CO2 27 22 - 32 mmol/L   Glucose, Bld 82 70 - 99 mg/dL    Comment: Glucose reference range applies only to samples taken after fasting for at least 8 hours.   BUN 13 6 - 20 mg/dL   Creatinine, Ser 9.14 0.44 - 1.00 mg/dL   Calcium 8.8 (L) 8.9 - 10.3 mg/dL   Total  Protein 7.4 6.5 - 8.1 g/dL   Albumin 3.7 3.5 - 5.0 g/dL   AST 33 15 - 41 U/L   ALT 204 (H) 0 - 44 U/L    Alkaline Phosphatase 54 38 - 126 U/L   Total Bilirubin 1.3 (H) 0.3 - 1.2 mg/dL   GFR, Estimated >62 >95 mL/min    Comment: (NOTE) Calculated using the CKD-EPI Creatinine Equation (2021)    Anion gap 9 5 - 15    Comment: Performed at [facility], [address], [place], [place] [address]  Hemoglobin A1c     Status: None   Collection Time: 01/23/23  6:48 AM  Result Value Ref Range   Hgb A1c MFr Bld 5.4 4.8 - 5.6 %    Comment: (NOTE) Pre diabetes:          5.7%-6.4%  Diabetes:              >6.4%  Glycemic control for   <7.0% adults with diabetes    Mean Plasma Glucose 108.28 mg/dL    Comment: Performed at [facility], [address] [place], [place] [address]  Lipid panel     Status: None   Collection Time: 01/23/23  6:48 AM  Result Value Ref Range   Cholesterol 129 0 - 200 mg/dL   Triglycerides 49 <027 mg/dL   HDL 62 >25 mg/dL   Total CHOL/HDL Ratio 2.1 RATIO   VLDL 10 0 - 40 mg/dL   LDL Cholesterol 57 0 - 99 mg/dL    Comment:        Total Cholesterol/HDL:CHD Risk Coronary Heart Disease Risk Table                     Men   Women  1/2 Average Risk   3.4   3.3  Average Risk       5.0   4.4  2 X Average Risk   9.6   7.1  3 X Average Risk  23.4   11.0        Use the calculated Patient Ratio above and the CHD Risk Table to determine the patient's CHD Risk.        ATP III CLASSIFICATION (LDL):  <100     mg/dL   Optimal  366-440  mg/dL   Near or Above                    Optimal  130-159  mg/dL   Borderline  347-425  mg/dL   High  >956     mg/dL   Very High Performed at [facility], [address], [place], [place] [address]     Blood Alcohol level:  Lab Results  Component Value Date   ETH <10 01/17/2023    Metabolic Labs: Lab Results  Component Value Date   HGBA1C 5.4 01/23/2023   MPG 108.28 01/23/2023   No results found for: "PROLACTIN" Lab Results  Component Value Date   CHOL 129 01/23/2023   TRIG 49  01/23/2023   HDL 62 01/23/2023   CHOLHDL 2.1 01/23/2023   VLDL 10 01/23/2023   LDLCALC 57 01/23/2023    Physical Findings:  Psychiatric Specialty Exam: Physical Exam Vitals reviewed.  Constitutional:      General: She is not in acute distress.    Appearance: She is not ill-appearing or toxic-appearing.  HENT:     Head: Normocephalic.  Cardiovascular:     Rate and Rhythm:  Regular rhythm. Tachycardia present.     Pulses: Normal pulses.     Heart sounds: Normal heart sounds.  Pulmonary:     Effort: Pulmonary effort is normal.     Breath sounds: Normal breath sounds.  Musculoskeletal:     Cervical back: Normal range of motion.  Skin:    General: Skin is warm.     Capillary Refill: Capillary refill takes less than 2 seconds.  Neurological:     General: No focal deficit present.     Mental Status: She is alert and oriented to person, place, and time.     Cranial Nerves: Cranial nerves 2-12 are intact. No facial asymmetry.     Sensory: Sensation is intact.     Motor: Motor function is intact. No weakness or abnormal muscle tone.     Review of Systems  Constitutional:  Negative for appetite change, diaphoresis, fatigue and fever.  Respiratory:  Negative for shortness of breath.   Cardiovascular:  Negative for chest pain.  Genitourinary: Negative.   Neurological:  Negative for dizziness and headaches.    Blood pressure 129/80, pulse 70, temperature 98 F (36.7 C), temperature source Oral, resp. rate 18, height 5\' 7"  (1.702 m), weight 79.9 kg, SpO2 100%.Body mass index is 27.6 kg/m.  General Appearance: Fairly Groomed and Guarded  Eye Contact:  Minimal  Speech:  Blocked  Volume:  Decreased  Mood:   "fine"  Affect:  Blunt  Thought Process:  Linear  Orientation:  Declined to answer  Thought Content:  Delusions and Ideas of Reference:   Paranoia  Suicidal Thoughts:  No  Homicidal Thoughts:  No  Memory:  Immediate;   NA Recent;   Poor Remote;   Fair  Judgement:   Impaired  Insight:  Lacking  Psychomotor Activity:  NA  Concentration:  Concentration: Fair  Fund of Knowledge:  Fair  Language:  Good  Akathisia:  No  Handed:  Right  AIMS (if indicated):     Assets:  Desire for Improvement Housing Resilience Social Support Talents/Skills  ADL's:  Intact  Cognition:  WNL  Sleep:  Number of Hours: 10.25    Assets  Assets: Social Support; Housing; Resilience   Treatment Plan Summary: Daily contact with patient to assess and evaluate symptoms and progress in treatment and Medication management  Diagnoses / Active Problems: MDD (major depressive disorder), recurrent episode, severe (HCC) Principal Problem:   MDD (major depressive disorder), recurrent episode, severe (HCC) Active Problems:   Suicidal ideations   Acute paranoia (HCC)   ASSESSMENT: Carolyn Salas is a 47 y.o., female with no past psychiatric history who presents to the [facility] Voluntary from [facility] Emergency Department for evaluation and management of suicidal thoughts, recent intentional acetaminophen overdose, and paranoia.   PLAN: Safety and Monitoring:             -- Involuntary admission to inpatient psychiatric unit for safety, stabilization and treatment             -- Daily contact with patient to assess and evaluate symptoms and progress in treatment             -- Patient's case to be discussed in multi-disciplinary team meeting             -- Observation Level : q15 minute checks             -- Vital signs: q12 hours             --  Precautions: suicide, elopement, and assault  -- Staff monitoring amount of patient intake during meals  -- Monitor patient taking medications     2. Psychiatric Diagnoses and Treatment:   Acute paranoia with persecutory delusions Major depressive disorder Recent intentional acetaminophen overdose Suicidal ideations with recent suicide attempt by cutting  Patient continues to be paranoid (being  watched, conversations monitored, people's conversations "staged"), irritable, withdrawn, and has not made significant improvements yet with regard to her mood and paranoia with Abilify 5 mg and Zoloft 25. Pt has received only 2 doses of Abilify 5mg , so plan to continue Abilify at thise dose and trial increasing in Zoloft as that has been well-tolerated and may improve patient's mood. If patient's paranoia does not improve or worsens, consider adding additional medication such as an first generation antipsychotic or increase Abilify.   -Changed to Abilify to 5 mg in the morning              -Zoloft 25 mg in the morning. Patient has not received Abilify and Zoloft last night because she refused it.   - F/U labs as noted below               -- The risks/benefits/side-effects/alternatives to this medication were discussed in detail with the patient and time was given for questions. The patient consents to medication trial.  -- Metabolic profile and EKG monitoring obtained while on an atypical antipsychotic (BMI: Lipid Panel: HbgA1c: QTc:) : pending EKG     PRN medications for symptomatic management:              -- start acetaminophen 650 mg every 6 hours as needed for mild to moderate pain, fever, and headaches              -- start bismuth subsalicylate 524 mg oral chewable tablet every 3 hours as needed for diarrhea / loose stools              -- start aluminum-magnesium hydroxide + simethicone 30 mL every 4 hours as needed for heartburn or indigestion              -- start melatonin 3 mg bedtime as needed for insomnia             -- As needed agitation protocol in-place   The risks/benefits/side-effects/alternatives to the above medication were discussed in detail with the patient and time was given for questions. The patient consents to medication trial. FDA black box warnings, if present, were discussed.   The patient is agreeable with the medication plan, as above. We will monitor the  patient's response to pharmacologic treatment, and adjust medications as necessary.   3. Routine and other pertinent labs: EKG monitoring: QTc: EKG ordered on 9/24   Transaminitis, resolving Hypokalemia, resolved Initial CMP at admission showed improving hypokalemia, corrected Ca 8.9, elevated total bili downtrending.   - ALT and total bilirubin elevated but downtrending. AST within normal limits. A1c 5.4.  - Hypokalemia resolved without addition of potassium chloride supplement. - Continue to monitor LFTs, replete electrolytes PRN.  - F/u ammonia level  4. Group Therapy:             -- Encouraged patient to participate in unit milieu and in scheduled group therapies              -- Short Term Goals: Ability to identify changes in lifestyle to reduce recurrence of condition, verbalize feelings, identify and develop effective coping behaviors, maintain clinical measurements  within normal limits, and identify triggers associated with substance abuse/mental health issues will improve. Improvement in ability to disclose and discuss suicidal ideas, demonstrate self-control, and comply with prescribed medications.             -- Long Term Goals: Improvement in symptoms so as ready for discharge -- Patient is encouraged to participate in group therapy while admitted to the psychiatric unit. -- We will address other chronic and acute stressors, which contributed to the patient's MDD (major depressive disorder), recurrent episode, severe (HCC) in order to reduce the risk of self-harm at discharge.   5. Discharge Planning:              -- Social work and case management to assist with discharge planning and identification of hospital follow-up needs prior to discharge             -- Estimated LOS: 5-7 days             -- Discharge Concerns: Need to establish a safety plan; Medication compliance and effectiveness             -- Discharge Goals: Return home with outpatient referrals for mental health  follow-up including medication management/psychotherapy  I certify that inpatient services furnished can reasonably be expected to improve the patient's condition.   Signed: Rex Kras, MD 01/24/2023, 11:27 AM Patient ID: Carolyn Salas, female   DOB: 05/12/1975, 47 y.o.   MRN: [id number]

## 2023-01-24 NOTE — Plan of Care (Signed)
  Problem: Education: Goal: Mental status will improve Outcome: Not Progressing   

## 2023-01-24 NOTE — Progress Notes (Signed)
Patient refused labs this evening, despite much encouragement from staff. Pt stated, "I'm not going to give blood again. Last time they injected me with something that made me dizzy." This nurse tried to reassure pt, but pt was adamant that she wanted to wait and talk to the doctor first.

## 2023-01-24 NOTE — Group Note (Signed)
LCSW Group Therapy Note   Group Date: 01/24/2023 Start Time: 1000 End Time: 1100   Type of Therapy and Topic:  Group Therapy: Achieving Balance  Participation Level:  Did Not Attend  Description of Group:  This group will help everyone identify area of their lives where they desire more balance. What they desire less of in their lives and identify areas where they need to simplify and or intensify responsibilities. Acknowledging barriers that may interfere with them making changes. Also, what do they need they need to give up to achieve the balanced life that they want.   A lack of balance in work and life is not all uncommon! This exercise can help you understand how to foster work and life balance. It specifically helps you become aware of the areas that do need balance as well as the ones that prevent you from taking action. Therapeutic Goals:  To develop individualized plans to help improve the strength, mental agility and action step to attain balance in their lives.  To become more aware and encourage clients to look at their situation, identify any stressors, and formulate a plan to prioritize.     Summary of Patient Progress:    Pt was invited, did not attend  Therapeutic Modalities:  Task Centered  Strength Based Approach  Steffanie Dunn, LCSWA 01/24/2023  2:57 PM

## 2023-01-25 MED ORDER — SERTRALINE HCL 50 MG PO TABS
50.0000 mg | ORAL_TABLET | Freq: Every day | ORAL | Status: DC
  Administered 2023-01-25 – 2023-01-30 (×6): 50 mg via ORAL
  Filled 2023-01-25 (×10): qty 1

## 2023-01-25 MED ORDER — HALOPERIDOL 2 MG PO TABS
2.0000 mg | ORAL_TABLET | Freq: Two times a day (BID) | ORAL | Status: DC
  Filled 2023-01-25 (×5): qty 1

## 2023-01-25 MED ORDER — BENZTROPINE MESYLATE 1 MG PO TABS: 1.0000 mg | ORAL_TABLET | Freq: Two times a day (BID) | ORAL | Status: DC | PRN

## 2023-01-25 NOTE — Plan of Care (Signed)
  Problem: Education: Goal: Emotional status will improve Outcome: Progressing   Problem: Activity: Goal: Interest or engagement in activities will improve Outcome: Progressing   

## 2023-01-25 NOTE — Progress Notes (Signed)
[facility] MD Progress Note  01/25/2023 9:31 AM DENNISHA MOUSER  MRN:  [id number]  Principal Problem: MDD (major depressive disorder), recurrent episode, severe (HCC) Diagnosis: Principal Problem:   MDD (major depressive disorder), recurrent episode, severe (HCC) Active Problems:   Suicidal ideations   Acute paranoia (HCC)   Reason for Admission:  Carolyn Salas is a 47 y.o., female with no past psychiatric history who presented to the [facility] voluntary from [facility] Emergency Department for evaluation and management of suicidal thoughts, recent attempt to kill herself by taking an overdose of acetaminophen cutting herself. Patient feels overwhelmed and reports a 3-4 week history of paranoia with persecutory delusions. (admitted on 01/19/2023, total  LOS: 6 days ).   Yesterday the psychiatry team made the following recommendations:    - Continue Abilify 5 mg daily  for symptoms of paranoia, delusions.  But change the dose.  A.m.                         - Consider increasing dose tomorrow 9/27.                         - Consider adding low-dose haldol  - F/U labs             -Increase Zoloft 50 mg a day.   The patient's chart was reviewed and nursing notes were reviewed. The patient's case was discussed in multidisciplinary team meeting.   - Overnight events to report per chart review / staff report:  Staff reports that the patient refused the Zoloft this morning but to try Abilify.  She is a little bit more talkative but continues to be reluctant about taking medications.  She is also not attending groups.  She has had some nausea and she is not eating all of her food.  She has refused labs.  - This morning: On assessment remained in bed initially appeared disheveled and withdrawn and avoided eye contact.  Her speech was of low volume with some latency but no obvious looseness of associations or flight of ideas.  She continues to be delusional.  However she was a little bit  more talkative today she admits that her symptoms started while she was at school and she started perceiving her colleagues behavior had changed.  She has been struggling with the paranoia and felt very overwhelmed and states that she took the overdose and cut herself because she could not handle the feelings of lack of control. Additional collateral was obtained from the husband today.  Carla Drape was contacted at [phone].  According to the husband patient apparently had received her Moderna COVID-19  booster within a week prior to the onset of her symptoms.  He also reports that there was a significant change in her sense of smell around that time.  He wonders if there was a correlation with her Moderna vaccine and onset of a psychotic symptoms. He was also in agreement with the trial of low-dose Haldol for a couple of days to help with her acute paranoia.  Patient did not receive any PRN medications   -------------------------------- 9/26 Collateral information obtained Alphia Moh, patient's husband) Patient granted permission to speak to contact person without restrictions. During this conversation, I obtained further information regarding patient's history.  Husband reports he has been visiting his wife during visiting hours each night and feels like her paranoia has not improved. He reports pt has  poor eye contact and faces away from him during their visits. He also believes she has some memory issues or may be concealing information. For example, his wife is usually particular with her clothing and only bathes with shower shoes on; pt did not remember him dropping off shower shoes and did not say she has been using them. He also noticed she does not remember what she had eaten during meals that same day. Also discussed with husband that patient refused labs previous day. Collateral information from patient's husband dated 01/23/2023: Husband continues to endorse acute paranoia and no  significant improvement since admission.  He was informed that patient has been noncompliant with treatment and that we may consider low-dose Haldol on Saturday if she does not improve.  History of Present Illness:  Carolyn Salas is a 48 y.o., female with no past psychiatric history who presents to the [facility] Voluntary from [facility] Emergency Department for evaluation and management of suicidal thoughts, recent intentional acetaminophen overdose, and paranoia.   HPI: The patient was brought to [facility] from home by her husband on 9/21 after an intentional acetaminophen overdose on 9/18 and for intentionally cutting her neck and arms to inflict self-harm.   This morning pt reports she is in the hospital because she feels paranoid. Since August she feels like she is being watched and listened to, and she feels this is out of her control and being done by some unknown source with an unknown goal. She feels like this is interfering with her life and feels this source is speaking to her through "other [people] always having staged conversations" and sending her messages about her mistakes related to work to make her feel apologetic. She acknowledges she makes mistakes because she is human, and she is not worried about the messages in relation to herself because she is not causing other people's discomfort. States paranoia is unchanged in intensity, frequency since it began in August, and it is not alleviated or worsened by any factors. Because of this huge change in her life with work and feeling watched, she wanted to go to sleep and not wake up. Denies seeing or hearing the entity that is watching/listening to her. 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 She does not know the time course of being fired or if she will be dismissed.    Today pt reports feeling guilty, having decreased appetite by choice, and having some thoughts of  wanting to go to sleep and not wake up due to the overwhelm of her whole life changing. Denies suicidal plan and intent. Denies HI, AVH. Pt denies feeling depressed, having problems with sleep, energy, restlessness. Denies symptoms of mania including rapid or fast speech, increased impulsivity or grandiosity, concentration issues.  Denies trauma, abuse. Denies OCD symptoms.   Mode of transport to Hospital: family car via husband Current Outpatient (Home) Medication List: No prescriptions, otc, supplements PRN medication prior to evaluation: None   Chart review: On chart review, prior to this evaluation, patient intentionally took 50 tablets of regular strength Tylenol in a self-harm attempt. She presented to [facility] emergency department by husband from home on 01/17/2023 for intentional acetaminophen overdose, complaint of suicidal ideation. Per chart review husband reported pt also had superficial cuts to neck and wrists. Pt reported nausea/vomiting after taking acetaminophen, but was asymptomatic upon presentation to the ED. Patient vital signs in ED: afebrile, HR 79-90s, BP 130s-150 systolic, oxygen saturation  100% on room air.    Upon admission: Labs showed significantly elevated LFTS: AST 1204, ALT 1694. Normal T. bili and alkaline phosphatase. Normal acetaminophen level. Negative alcohol level. INR. Normal ammonia. Normal CBC. Urine drug screen negative. CT abdomen with IV contrast showed no acute GI changes.  Poison control consulted: recommended NAC, which was initiated. Patient received LR. CT abdomen/pelvis: no acute abdominal process   Pt admitted for further evaluation of intentional acetaminophen overdose in the setting of SI complicated by acute transaminitis, hypokalemia. NAC revealed resolving acute transaminitis with liver enzymes trending down.   At [facility], pt reported no history of mental illness or substance abuse. Reported attempted suicide due to poor decisions.  Reported severe depression characterized by recurrent suicidal thoughts, excessive sleep, poor appetite, lack of motivation, paranoia and low energy level. She denied drugs, alcohol use, homicidal thoughts, anxiety and psychosis .   Discharge recommendations: check LFTs in one week (9/28) Poison control signed off, recommended to discontinue NAC, cleared to transfer to [facility] when pt stable.     9/24 Collateral information obtained Alphia Moh, patient's husband) Patient granted permission to speak to contact person without restrictions. During this conversation, I obtained further information regarding patient's history.  Patient's husband recounts that patient's symptoms started about halfway through the second of week of school  (approximately 12/31/22), with patient started to become agitated. Told husband she "felt set up for failure" by her boss. She reported her boss how she felt, asked for feedback, and was reassured that she was doing well at work. Patient went up chain of command to next boss and HR because she felt paranoid, frustrated, and not heard. She was again reassured there were no problems at work. According to husband pt felt her bosses were part of a conspiracy to get her out of work. Next, she started talking about feeling followed, people were listening to her conversations, and that technology at school and at home were bugged or modified.    Patient took a leave of absence at work, and in 1.5 weeks her symptoms of paranoia was constant and continued to worsen. She became increasingly worried about her children leaving for school, became uncharacteristically and increasingly affectionate with the kids and hugged them before they left the house. Because of fear the phone was bugged and because she was receiving increased calls from concerned colleagues, patient changed her phone and phone number. When new phone had issues getting set up, pt attributed tech problems to the organization  spying on them. Husband reports patient had decreased sleep, decreased appetite for the past week, decreased energy (pt feeling tired, staying in bed), and increased restlessness (leg shaking often). Denies rapid speech, problems in communicating clearly or speaking illogically, risky behavior, grandiosity, auditory or visual hallucinations.   On 9/18 patient told husband she had a headache and said she took a couple of Tylenol. She was up all night vomiting. Two days later, pt was agitated in the evening and had a conversation with her husband that she felt they would not be watched or listened in on anymore if she resigned from her job. Eventually she went to bed before her husband, who saw her sleeping soundly.  Following morning (on 9/21) patient noticed some blood on comforter. Asked pt if she had a nosebleed, and she covered herself with the sheets. Husband pulled away the sheets to see patient had cut her neck and arms, and he rushed her to the hospital. In the ED her liver  enzymes were abnormally high, and after husband asked her multiple times, pt revealed she had shaken the bottle of Tylenol to her mouth to ingest an unknown number of pills. Husband found empty tylenol bottle at home. Husband also noted that they keep a gun next to their bed, and he immediately removed it and locked it in a secure safe. He is a Chief Financial Officer, and there are multiple guns appropriately secured in the household in a safe that only he has access to. He is currently coordinating with his brother to remove all weapons and ammunition from their house while patient is in hospital.    At baseline, he reports his wife is sharp, articulate, and well-regarded with her work as an Geophysicist/field seismologist vice principal. She gets along well with her colleagues and was even recognized with a party at work for her achievements during the year. She earned two Master's degrees and graduated top of her class. -Aside from school starting, he  denies any triggering events and any previous history of similar episodes or abnormal behavior.  -Denies any PMH of psychiatric and medical conditions, inpatient and outpatient treatment, medications. Denies head injury, seizures.  -Reports pt's family history include mother with an unspecified mental illness where "the roles are reversed: her mother acts like a child and [patient] behaves as the mom" by taking care of whatever pt's mother needs. Potentially her biological father's side has mental disorder, possibly bipolar or schizophrenia but not sure. Denies any family history of suicide.  -Social history: no alcohol in house, pt never drank, smoked, used recreational substances or abused prescriptions. Denies abuse/trauma. States patient had an interesting childhood: She was raised by a couple, and patient believed her father figure was her biological dad. Unfortunately when he was trying to get custody of her, paternity test revealed he was not the father, so patient and sibling were sent to live with grandma.      Past Psychiatric History:  Previous psych diagnoses: Denies Prior inpatient psychiatric treatment: Denies Prior outpatient psychiatric treatment: Denies Current psychiatric provider: Denies   Neuromodulation history: denies   Current therapist: Denies Psychotherapy hx: Denies   History of suicide attempts: Denies History of homicide: Denies   Psychotropic medications: None   Substance Use History: Alcohol: never drinks Tobacco: never smoked, vaped, or chewed tobacco Cannabis (marijuana): never tried Cocaine: never tried Methamphetamines: never tried Psilocybin (mushrooms): never tried Ecstasy (MDMA / molly): never tried LSD (acid): never tried Opiates (fentanyl / heroin): never tried Benzos (Xanax, Klonopin): never tried IV drug use: Never Prescribed meds abuse: Never   History of detox: Never History of rehab: Never   Is the patient at risk to self? Yes Has  the patient been a risk to self in the past 6 months? Yes Has the patient been a risk to self within the distant past? No Is the patient a risk to others? Yes Has the patient been a risk to others in the past 6 months? Yes Has the patient been a risk to others within the distant past? No   Alcohol Screening: 1. How often do you have a drink containing alcohol?: Never 2. How many drinks containing alcohol do you have on a typical day when you are drinking?: n/a 3. How often do you have six or more drinks on one occasion?: Never AUDIT-C Score: 0   Substance Abuse History in the last 12 months: No   Allergies: endorses the following medication allergies with resulting symptoms: Penicillin   Past Medical/Surgical  History:  Medical Diagnoses: None Home Rx: None Prior Hosp: None Prior Surgeries / non-head trauma: None   Head trauma: denies LOC: denies Concussions: denies Seizures: denies   Last menstrual period and contraceptives:    Family History:  Medical: Possibly father with diabetes Psych: Mother - "in her own world", disabled Psych Rx: Unknown Suicide: Unknown, denies Homicide: Denies Substance use family hx: Denies   Social History:  Place of birth and grew up where: [place], [place]; grew up in [place] Abuse: no history of abuse Marital Status: Married to [place] for 15 years. Two children together. Raising godson for 8 years Sexual orientation: straight Children: 2 with current partner. 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 See collateral provided in HPI by husband for further details Pills stockpile: Denies  Current Medications: Current Facility-Administered Medications   Medication Dose Route Frequency Provider Last Rate Last Admin   acetaminophen (TYLENOL) tablet 650 mg  650 mg Oral Q6H PRN Starkes-Perry, Juel Burrow, FNP       alum & mag hydroxide-simeth (MAALOX/MYLANTA) 200-200-20 MG/5ML suspension 30 mL  30 mL Oral Q4H PRN Starkes-Perry, Juel Burrow, FNP       ARIPiprazole (ABILIFY) tablet 5 mg  5 mg Oral Daily Rex Kras, MD   5 mg at 01/25/23 0805   diphenhydrAMINE (BENADRYL) capsule 50 mg  50 mg Oral TID PRN Maryagnes Amos, FNP       Or   diphenhydrAMINE (BENADRYL) injection 50 mg  50 mg Intramuscular TID PRN Starkes-Perry, Juel Burrow, FNP       feeding supplement (ENSURE ENLIVE / ENSURE PLUS) liquid 237 mL  237 mL Oral BID BM Attiah, Nadir, MD       haloperidol (HALDOL) tablet 5 mg  5 mg Oral TID PRN Starkes-Perry, Juel Burrow, FNP       Or   haloperidol lactate (HALDOL) injection 5 mg  5 mg Intramuscular TID PRN Starkes-Perry, Juel Burrow, FNP       ibuprofen (ADVIL) tablet 400 mg  400 mg Oral Q6H PRN Starkes-Perry, Juel Burrow, FNP       LORazepam (ATIVAN) tablet 2 mg  2 mg Oral TID PRN Starkes-Perry, Juel Burrow, FNP       Or   LORazepam (ATIVAN) injection 2 mg  2 mg Intramuscular TID PRN Starkes-Perry, Juel Burrow, FNP       magnesium hydroxide (MILK OF MAGNESIA) suspension 30 mL  30 mL Oral Daily PRN Starkes-Perry, Juel Burrow, FNP       melatonin tablet 3 mg  3 mg Oral QHS PRN Starkes-Perry, Juel Burrow, FNP       sertraline (ZOLOFT) tablet 50 mg  50 mg Oral QPC supper Rex Kras, MD        Lab Results:  No results found for this or any previous visit (from the past 48 hour(s)).   Blood Alcohol level:  Lab Results  Component Value Date   ETH <10 01/17/2023    Metabolic Labs: Lab Results  Component Value Date   HGBA1C 5.4 01/23/2023   MPG 108.28 01/23/2023   No results found for: "PROLACTIN" Lab Results  Component Value Date   CHOL 129 01/23/2023   TRIG 49 01/23/2023   HDL 62 01/23/2023   CHOLHDL 2.1 01/23/2023  VLDL 10 01/23/2023   LDLCALC  57 01/23/2023    Physical Findings:  Psychiatric Specialty Exam: Physical Exam Vitals reviewed.  Constitutional:      General: She is not in acute distress.    Appearance: She is not ill-appearing or toxic-appearing.  HENT:     Head: Normocephalic.  Cardiovascular:     Rate and Rhythm: Regular rhythm. Tachycardia present.     Pulses: Normal pulses.     Heart sounds: Normal heart sounds.  Pulmonary:     Effort: Pulmonary effort is normal.     Breath sounds: Normal breath sounds.  Musculoskeletal:     Cervical back: Normal range of motion.  Skin:    General: Skin is warm.     Capillary Refill: Capillary refill takes less than 2 seconds.  Neurological:     General: No focal deficit present.     Mental Status: She is alert and oriented to person, place, and time.     Cranial Nerves: Cranial nerves 2-12 are intact. No facial asymmetry.     Sensory: Sensation is intact.     Motor: Motor function is intact. No weakness or abnormal muscle tone.     Review of Systems  Constitutional:  Negative for appetite change, diaphoresis, fatigue and fever.  Respiratory:  Negative for shortness of breath.   Cardiovascular:  Negative for chest pain.  Genitourinary: Negative.   Neurological:  Negative for dizziness and headaches.    Blood pressure 113/85, pulse (!) 106, temperature 99.3 F (37.4 C), temperature source Oral, resp. rate 18, height 5\' 7"  (1.702 m), weight 79.9 kg, SpO2 98%.Body mass index is 27.6 kg/m.  General Appearance: Fairly Groomed and Guarded  Eye Contact:  Minimal  Speech:  Blocked  Volume:  Decreased  Mood:   "fine"  Affect:  Blunt  Thought Process:  Linear  Orientation:  Declined to answer  Thought Content:  Delusions and Ideas of Reference:   Paranoia  Suicidal Thoughts:  No  Homicidal Thoughts:  No  Memory:  Immediate;   NA Recent;   Poor Remote;   Fair  Judgement:  Impaired  Insight:  Lacking  Psychomotor Activity:  NA  Concentration:  Concentration:  Fair  Fund of Knowledge:  Fair  Language:  Good  Akathisia:  No  Handed:  Right  AIMS (if indicated):     Assets:  Desire for Improvement Housing Resilience Social Support Talents/Skills  ADL's:  Intact  Cognition:  WNL  Sleep:  Number of Hours: 10.25    Assets  Assets: Desire for Improvement; Housing; Leisure Time; Talents/Skills   Treatment Plan Summary: Daily contact with patient to assess and evaluate symptoms and progress in treatment and Medication management  Diagnoses / Active Problems: MDD (major depressive disorder), recurrent episode, severe (HCC) Principal Problem:   MDD (major depressive disorder), recurrent episode, severe (HCC) Active Problems:   Suicidal ideations   Acute paranoia (HCC)   ASSESSMENT: Carolyn Salas is a 47 y.o., female with no past psychiatric history who presents to the [facility] Voluntary from [facility] Emergency Department for evaluation and management of suicidal thoughts, recent intentional acetaminophen overdose, and paranoia.   PLAN: Safety and Monitoring:             -- Involuntary admission to inpatient psychiatric unit for safety, stabilization and treatment             -- Daily contact with patient to assess and evaluate symptoms and progress in treatment             --  Patient's case to be discussed in multi-disciplinary team meeting             -- Observation Level : q15 minute checks             -- Vital signs: q12 hours             -- Precautions: suicide, elopement, and assault  -- Staff monitoring amount of patient intake during meals  -- Monitor patient taking medications     2. Psychiatric Diagnoses and Treatment:   Acute paranoia with persecutory delusions Rule out new onset psychosis following COVID-19 vaccination. Major depressive disorder Recent intentional acetaminophen overdose Suicidal ideations with recent suicide attempt by cutting  Patient continues to be paranoid (being watched,  conversations monitored, people's conversations "staged"), irritable, withdrawn, and has not made significant improvements yet with regard to her mood and paranoia with Abilify 5 mg and Zoloft 25. Pt has received only 2 doses of Abilify 5mg , so plan to continue Abilify at thise dose and trial increasing in Zoloft as that has been well-tolerated and may improve patient's mood. If patient's paranoia does not improve or worsens, consider adding additional medication such as an first generation antipsychotic or increase Abilify.   -Changed to Abilify to 5 mg in the morning              -Zoloft 50 mg at 6 PM             -Empirical trial of Haldol 2 mg twice a day for 2 days    - F/U labs as noted below               -- The risks/benefits/side-effects/alternatives to this medication were discussed in detail with the patient and time was given for questions. The patient consents to medication trial.  -- Metabolic profile and EKG monitoring obtained while on an atypical antipsychotic (BMI: Lipid Panel: HbgA1c: QTc:) : pending EKG     PRN medications for symptomatic management:              -- start acetaminophen 650 mg every 6 hours as needed for mild to moderate pain, fever, and headaches              -- start bismuth subsalicylate 524 mg oral chewable tablet every 3 hours as needed for diarrhea / loose stools              -- start aluminum-magnesium hydroxide + simethicone 30 mL every 4 hours as needed for heartburn or indigestion              -- start melatonin 3 mg bedtime as needed for insomnia             -- As needed agitation protocol in-place   The risks/benefits/side-effects/alternatives to the above medication were discussed in detail with the patient and time was given for questions. The patient consents to medication trial. FDA black box warnings, if present, were discussed.   The patient is agreeable with the medication plan, as above. We will monitor the patient's response to  pharmacologic treatment, and adjust medications as necessary.   3. Routine and other pertinent labs: EKG monitoring: QTc: EKG ordered on 9/24   Transaminitis, resolving Hypokalemia, resolved Initial CMP at admission showed improving hypokalemia, corrected Ca 8.9, elevated total bili downtrending.   - ALT and total bilirubin elevated but downtrending. AST within normal limits. A1c 5.4.  - Hypokalemia resolved without addition of potassium chloride supplement. - Continue to  monitor LFTs, replete electrolytes PRN.  - F/u ammonia level  4. Group Therapy:             -- Encouraged patient to participate in unit milieu and in scheduled group therapies              -- Short Term Goals: Ability to identify changes in lifestyle to reduce recurrence of condition, verbalize feelings, identify and develop effective coping behaviors, maintain clinical measurements within normal limits, and identify triggers associated with substance abuse/mental health issues will improve. Improvement in ability to disclose and discuss suicidal ideas, demonstrate self-control, and comply with prescribed medications.             -- Long Term Goals: Improvement in symptoms so as ready for discharge -- Patient is encouraged to participate in group therapy while admitted to the psychiatric unit. -- We will address other chronic and acute stressors, which contributed to the patient's MDD (major depressive disorder), recurrent episode, severe (HCC) in order to reduce the risk of self-harm at discharge.   5. Discharge Planning:              -- Social work and case management to assist with discharge planning and identification of hospital follow-up needs prior to discharge             -- Estimated LOS: By Tuesday.             -- Discharge Concerns: Need to establish a safety plan; Medication compliance and effectiveness             -- Discharge Goals: Return home with outpatient referrals for mental health follow-up including  medication management/psychotherapy  I certify that inpatient services furnished can reasonably be expected to improve the patient's condition.   Signed: Rex Kras, MD 01/25/2023, 9:31 AM Patient ID: Carolyn Salas, female   DOB: Feb 22, 1976, 47 y.o.   MRN: [id number] patient ID: Carolyn Salas, female   DOB: 1975/10/27, 47 y.o.   MRN: [id number]

## 2023-01-25 NOTE — Progress Notes (Signed)
Patient rated her depression level 0/10 and her anxiety level 0/10 with 10 being the highest and 0 none. Patient stated her goal for today as," Go to a group afternoon or evening". Patient refused ordered Haldol 2 mg po, medication offered x 3 to pt with explanation on the importance of taking same. Pt continue to refuse, Physician notified. Out of bed for meals. Pt declined to attend outdoor group therapy or come out into the milieu. Patient denied SI/HI and AVH. Safety maintained.  01/25/23 4540  Psych Admission Type (Psych Patients Only)  Admission Status Voluntary  Psychosocial Assessment  Patient Complaints Depression;Suspiciousness  Eye Contact Brief  Facial Expression Flat  Affect Apprehensive;Irritable;Flat  Speech Argumentative  Interaction Guarded  Motor Activity Slow  Appearance/Hygiene Disheveled  Behavior Characteristics Irritable;Resistant to care;Guarded  Mood Depressed;Irritable  Thought Process  Coherency WDL  Content Preoccupation;Paranoia  Delusions None reported or observed  Perception WDL  Hallucination None reported or observed  Judgment Poor  Confusion None  Danger to Self  Current suicidal ideation? Denies  Self-Injurious Behavior No self-injurious ideation or behavior indicators observed or expressed   Agreement Not to Harm Self Yes  Description of Agreement Verbal  Danger to Others  Danger to Others None reported or observed  Danger to Others Abnormal  Harmful Behavior to others No threats or harm toward other people  Destructive Behavior No threats or harm toward property

## 2023-01-25 NOTE — Group Note (Signed)
Date:  01/25/2023 Time:  11:10 AM  Group Topic/Focus:  Goals Group:   The focus of this group is to help patients establish daily goals to achieve during treatment and discuss how the patient can incorporate goal setting into their daily lives to aide in recovery.    Participation Level:  Did Not Attend  Additional Comments:  Patient was encouraged to attend group multiple times.   Jessic Standifer T Nassir Neidert 01/25/2023, 11:10 AM

## 2023-01-25 NOTE — [facility] Group Notes (Signed)
[facility] Group Notes:  (Nursing/MHT/Case Management/Adjunct)  Date:  01/25/2023  Time:  9:42 PM  Type of Therapy:  The focus of this group is to help patients establish daily goals to achieve during treatment and discuss how the patient can incorporate goal setting into their daily lives to aide in recovery.    Participation Level:  Active  Participation Quality:  Appropriate  Affect:  Appropriate  Cognitive:  Alert and Appropriate  Insight:  Appropriate, Good, and Improving  Engagement in Group:  Engaged and Supportive  Modes of Intervention:  Socialization and Support  Summary of Progress/Problems: Pt attended group  Granville Lewis 01/25/2023, 9:42 PM

## 2023-01-25 NOTE — Progress Notes (Signed)
   01/25/23 0708  15 Minute Checks  Location Cafeteria  Visual Appearance Calm  Behavior Composed  Sleep (Behavioral Health Patients Only)  Calculate sleep? (Click Yes once per 24 hr at 0600 safety check) Yes  Documented sleep last 24 hours 9

## 2023-01-25 NOTE — Plan of Care (Signed)
  Problem: Activity: Goal: Sleeping patterns will improve Outcome: Progressing   Problem: Safety: Goal: Periods of time without injury will increase Outcome: Progressing   Problem: Self-Concept: Goal: Level of anxiety will decrease Outcome: Progressing

## 2023-01-25 NOTE — Progress Notes (Signed)
   01/25/23 2147  Psych Admission Type (Psych Patients Only)  Admission Status Voluntary  Psychosocial Assessment  Patient Complaints Suspiciousness;Depression  Eye Contact Brief  Facial Expression Flat  Affect Preoccupied;Irritable;Apprehensive  Speech Soft;Logical/coherent  Interaction Guarded;Forwards little;Isolative  Motor Activity Other (Comment) (WDL)  Appearance/Hygiene Disheveled  Behavior Characteristics Irritable;Guarded;Resistant to care  Mood Irritable;Depressed  Thought Process  Coherency WDL  Content Paranoia;Preoccupation  Delusions Persecutory;Paranoid  Perception Illusions  Hallucination None reported or observed  Judgment Poor  Confusion None  Danger to Self  Current suicidal ideation? Denies  Self-Injurious Behavior No self-injurious ideation or behavior indicators observed or expressed   Agreement Not to Harm Self Yes  Description of Agreement verbal  Danger to Others  Danger to Others None reported or observed  Danger to Others Abnormal  Harmful Behavior to others No threats or harm toward other people  Destructive Behavior No threats or harm toward property

## 2023-01-26 ENCOUNTER — Encounter (HOSPITAL_COMMUNITY): Payer: Self-pay

## 2023-01-26 MED ORDER — ARIPIPRAZOLE 5 MG PO TABS
5.0000 mg | ORAL_TABLET | Freq: Two times a day (BID) | ORAL | Status: DC
  Administered 2023-01-26: 5 mg via ORAL
  Filled 2023-01-26 (×5): qty 1

## 2023-01-26 NOTE — Plan of Care (Signed)
  Problem: Education: Goal: Emotional status will improve Outcome: Progressing   Problem: Education: Goal: Mental status will improve Outcome: Progressing   Problem: Safety: Goal: Periods of time without injury will increase Outcome: Progressing   Problem: Medication: Goal: Compliance with prescribed medication regimen will improve Outcome: Progressing

## 2023-01-26 NOTE — Progress Notes (Addendum)
[facility] MD Progress Note  01/26/2023 12:33 PM Carolyn Salas  MRN:  [id number]  Principal Problem: MDD (major depressive disorder), recurrent episode, severe (HCC) Diagnosis: Principal Problem:   MDD (major depressive disorder), recurrent episode, severe (HCC) Active Problems:   Suicidal ideations   Acute paranoia (HCC)   Reason for Admission:  Carolyn Salas is a 47 y.o. female with no past psychiatric history who presented to the [facility] voluntary from [facility] for evaluation and management of suicidal thoughts, recent attempt to kill herself by taking an overdose of acetaminophen cutting herself. Patient feels overwhelmed and reports a 3-4 week history of paranoia with persecutory delusions   (admitted on 01/19/2023, total  LOS: 7 days )  Chart Review from last 24 hours:  The patient's chart was reviewed and nursing notes were reviewed. The patient's case was discussed in multidisciplinary team meeting.   - Overnight events to report per chart review / staff report: no notable overnight events to report - Patient received all scheduled medications except: haldol 2 mg BID   - Patient did not receive any PRN medications  Information Obtained Today During Patient Interview: The patient was seen and evaluated on the unit. On assessment today the patient reports that she is fine. When asked why she is refusing haldol, she states " I don't need it". Denies any depressive or anxiety symptoms. Spoke with husband and states that conversation went well. Patient reports that zoloft makes here sleepy and her stomach hurts. She is unable to attend groups due to sedation. Feels like she has to choose between taking meds or going to groups. Patient endorses fair sleep; endorses fair appetite.  Collateral Information, Carolyn Salas was contacted at [phone] Husband came and visited yesterday. Reports that he's noticed a 10-15% improvement. She continues to be  very limited on reposes and mostly been yes or no. Had a few moments, yesterday where she engaged in conversation however, is far from baseline. Patient has 2 master degrees, is an Geophysicist/field seismologist principal and communication skills have always been great. Her behavior has still continued to be no where near normal. Believes that her paranoia and guardedness towards this provider is related to potential fraternity association. Patients paranoia first started when she thought several administrators at her school were out to get her. These administrators belong to a certain [place] organization and he believes she could assume that this provider is a member of that organization. Reports that these symptoms, started 1 week after she received her moderna vaccine. Patient had only previously taken the pfizer covid vaccine and hadn't had any issues. He's been doing research, and has found literature of similar scenarios happening after the vaccine administration. He will continue to visit and monitor for improvement. Whenever, she is stable for discharge, he has already performed safety planning, locked away all the medicines and guns to provide a safe environment. .   Past Psychiatric History:  Previous psych diagnoses: Denies Prior inpatient psychiatric treatment: Denies Prior outpatient psychiatric treatment: Denies Current psychiatric provider: Denies  Neuromodulation history: denies Current therapist: Denies Psychotherapy hx: Denies  History of suicide attempts: Denies History of homicide: Denies  Past Medical History: History reviewed. No pertinent past medical history.  Family Psychiatric History:  Reports pt's family history include mother with an unspecified mental illness where "the roles are reversed: her mother acts like a child and [patient] behaves as the mom" by taking care of whatever pt's mother needs. Potentially her biological father's side  has mental disorder, possibly bipolar or schizophrenia  but not sure. Denies any family history of suicide.   Social History:  no alcohol in house, pt never drank, smoked, used recreational substances or abused prescriptions. Denies abuse/trauma. States patient had an interesting childhood: She was raised by a couple, and patient believed her father figure was her biological dad. Unfortunately when he was trying to get custody of her, paternity test revealed he was not the father, so patient and sibling were sent to live with grandma.    Current Medications: Current Facility-Administered Medications  Medication Dose Route Frequency Provider Last Rate Last Admin   acetaminophen (TYLENOL) tablet 650 mg  650 mg Oral Q6H PRN Starkes-Perry, Juel Burrow, FNP       alum & mag hydroxide-simeth (MAALOX/MYLANTA) 200-200-20 MG/5ML suspension 30 mL  30 mL Oral Q4H PRN Starkes-Perry, Juel Burrow, FNP       ARIPiprazole (ABILIFY) tablet 5 mg  5 mg Oral BID Peterson Ao, MD   5 mg at 01/26/23 1102   benztropine (COGENTIN) tablet 1 mg  1 mg Oral BID PRN Rex Kras, MD       diphenhydrAMINE (BENADRYL) capsule 50 mg  50 mg Oral TID PRN Maryagnes Amos, FNP       Or   diphenhydrAMINE (BENADRYL) injection 50 mg  50 mg Intramuscular TID PRN Starkes-Perry, Juel Burrow, FNP       feeding supplement (ENSURE ENLIVE / ENSURE PLUS) liquid 237 mL  237 mL Oral BID BM Attiah, Nadir, MD       haloperidol (HALDOL) tablet 2 mg  2 mg Oral BID Rex Kras, MD       haloperidol (HALDOL) tablet 5 mg  5 mg Oral TID PRN Maryagnes Amos, FNP       Or   haloperidol lactate (HALDOL) injection 5 mg  5 mg Intramuscular TID PRN Starkes-Perry, Juel Burrow, FNP       ibuprofen (ADVIL) tablet 400 mg  400 mg Oral Q6H PRN Starkes-Perry, Juel Burrow, FNP       LORazepam (ATIVAN) tablet 2 mg  2 mg Oral TID PRN Starkes-Perry, Juel Burrow, FNP       Or   LORazepam (ATIVAN) injection 2 mg  2 mg Intramuscular TID PRN Starkes-Perry, Juel Burrow, FNP       magnesium hydroxide (MILK OF MAGNESIA)  suspension 30 mL  30 mL Oral Daily PRN Starkes-Perry, Juel Burrow, FNP       melatonin tablet 3 mg  3 mg Oral QHS PRN Starkes-Perry, Juel Burrow, FNP       sertraline (ZOLOFT) tablet 50 mg  50 mg Oral QPC supper Rex Kras, MD   50 mg at 01/25/23 1806    Lab Results: No results found for this or any previous visit (from the past 48 hour(s)).  Blood Alcohol level:  Lab Results  Component Value Date   ETH <10 01/17/2023    Metabolic Labs: Lab Results  Component Value Date   HGBA1C 5.4 01/23/2023   MPG 108.28 01/23/2023   No results found for: "PROLACTIN" Lab Results  Component Value Date   CHOL 129 01/23/2023   TRIG 49 01/23/2023   HDL 62 01/23/2023   CHOLHDL 2.1 01/23/2023   VLDL 10 01/23/2023   LDLCALC 57 01/23/2023    Physical Findings: AIMS: No  Psychiatric Specialty Exam: General Appearance:  Appropriate for Environment; Casual   Eye Contact:  Minimal   Speech:  Normal Rate   Volume:  Normal  Mood:  Euthymic   Affect:  Non-Congruent; Constricted   Thought Content:  Paranoid Ideation   Suicidal Thoughts:  Suicidal Thoughts: No SI Active Intent and/or Plan: With Intent; With Plan (Took a serious overdose prior to admission)   Homicidal Thoughts:  Homicidal Thoughts: No   Thought Process:  Linear   Orientation:  Full (Time, Place and Person)     Memory:  Immediate Fair; Recent Fair   Judgment:  Poor   Insight:  Poor   Concentration:  Fair   Recall:  [employer] of Knowledge:  Fair   Language:  Fair   Psychomotor Activity:  Psychomotor Activity: Normal   Assets:  Desire for Improvement; Social Support; Housing; Vocational/Educational; Surveyor, quantity Resources/Insurance   Sleep:  Sleep: Good Number of Hours of Sleep: 9    Review of Systems Review of Systems  Constitutional:  Negative for chills and fever.  Respiratory:  Negative for cough.   Cardiovascular:  Negative for chest pain.  Gastrointestinal:  Negative for  nausea and vomiting.  Neurological:  Negative for weakness and headaches.  Psychiatric/Behavioral:  Negative for depression, hallucinations, substance abuse and suicidal ideas. The patient is not nervous/anxious and does not have insomnia.     Vital Signs: Blood pressure 103/74, pulse (!) 121, temperature 98.9 F (37.2 C), temperature source Oral, resp. rate 18, height 5\' 7"  (1.702 m), weight 79.9 kg, SpO2 97%. Body mass index is 27.6 kg/m. Physical Exam Constitutional:      Appearance: Normal appearance.  Neurological:     General: No focal deficit present.     Mental Status: She is alert and oriented to person, place, and time.  Psychiatric:        Attention and Perception: She does not perceive auditory or visual hallucinations.        Mood and Affect: Mood is not anxious, depressed or elated. Affect is not flat.        Speech: Speech is not rapid and pressured.        Behavior: Behavior is cooperative.     Comments: Constricted affect    Assets  Assets: Desire for Improvement; Social Support; Housing; Vocational/Educational; Printmaker Plan Summary: Daily contact with patient to assess and evaluate symptoms and progress in treatment and Medication management  Diagnoses / Active Problems: MDD (major depressive disorder), recurrent episode, severe (HCC) Principal Problem:   MDD (major depressive disorder), recurrent episode, severe (HCC) Active Problems:   Suicidal ideations   Acute paranoia (HCC)   ASSESSMENT: Carolyn Salas is a 47 y.o. female with no past psychiatric history who presented to the [facility] voluntary from [facility] Emergency Department for evaluation and management of suicidal thoughts, recent attempt to kill herself by taking an overdose of acetaminophen or cutting herself. Patient feels overwhelmed and reports a 3-4 week history of paranoia with persecutory delusions   Patient continues to be paranoid  (being watched, conversations monitored, people's conversations "staged"), irritable, withdrawn, and has not made significant improvements yet with regard to her mood and paranoia with Abilify 5 mg and Zoloft 50. Attempted trial of low dose haldol however, patient refusing to take meds. Will increase Abilify 5 mg to BID dosing. Will call husband for additional collateral, if he's noticing any improvements in patients paranoid behavior.   PLAN: Safety and Monitoring:  -- Involuntary admission to inpatient psychiatric unit for safety, stabilization and treatment  -- Daily contact with patient to assess and evaluate symptoms and progress in treatment  --  Patient's case to be discussed in multi-disciplinary team meeting  -- Observation Level : q15 minute checks  -- Vital signs:  q12 hours  -- Precautions: suicide, elopement, and assault  2. Interventions (medications, psychoeducation, etc):  Acute paranoia with persecutory delusions Rule out new onset psychosis following COVID-19 vaccination. Major depressive disorder Recent intentional acetaminophen overdose Suicidal ideations with recent suicide attempt by cutting               -Increased Abilify to 5 mg BID              -Zoloft 50 mg in the evening              - Discontinued trial of Haldol 2 mg BID   - F/U labs as noted below                -- The risks/benefits/side-effects/alternatives to this medication were discussed in detail with the patient and time was given for questions. The patient consents to medication trial.  -- Metabolic profile and EKG monitoring obtained while on an atypical antipsychotic (BMI: Lipid Panel: HbgA1c: QTc:) : pending EKG     PRN medications for symptomatic management:              -- start acetaminophen 650 mg every 6 hours as needed for mild to moderate pain, fever, and headaches              -- start bismuth subsalicylate 524 mg oral chewable tablet every 3 hours as needed for diarrhea / loose stools               -- start aluminum-magnesium hydroxide + simethicone 30 mL every 4 hours as needed for heartburn or indigestion              -- start melatonin 3 mg bedtime as needed for insomnia             -- As needed agitation protocol in-place   The risks/benefits/side-effects/alternatives to the above medication were discussed in detail with the patient and time was given for questions. The patient consents to medication trial. FDA black box warnings, if present, were discussed.   The patient is agreeable with the medication plan, as above. We will monitor the patient's response to pharmacologic treatment, and adjust medications as necessary.  -- As needed agitation protocol in-place  The risks/benefits/side-effects/alternatives to the above medication were discussed in detail with the patient and time was given for questions. The patient consents to medication trial. FDA black box warnings, if present, were discussed.  The patient is agreeable with the medication plan, as above. We will monitor the patient's response to pharmacologic treatment, and adjust medications as necessary.  3. Routine and other pertinent labs:             -- Metabolic profile:  BMI: Body mass index is 27.6 kg/m.  Prolactin: No results found for: "PROLACTIN"  Lipid Panel: Lab Results  Component Value Date   CHOL 129 01/23/2023   TRIG 49 01/23/2023   HDL 62 01/23/2023   CHOLHDL 2.1 01/23/2023   VLDL 10 01/23/2023   LDLCALC 57 01/23/2023    HbgA1c: Hgb A1c MFr Bld (%)  Date Value  01/23/2023 5.4    TSH: TSH (uIU/mL)  Date Value  01/18/2023 0.954    EKG monitoring: QTc: 397  4. Group Therapy:  -- Encouraged patient to participate in unit milieu and in scheduled group therapies   -- Short Term Goals:  Ability to identify changes in lifestyle to reduce recurrence of condition, verbalize feelings, identify and develop effective coping behaviors, maintain clinical measurements within normal limits,  and identify triggers associated with substance abuse/mental health issues will improve. Improvement in ability to disclose and discuss suicidal ideas, demonstrate self-control, and comply with prescribed medications.  -- Long Term Goals: Improvement in symptoms so as ready for discharge -- Patient is encouraged to participate in group therapy while admitted to the psychiatric unit. -- We will address other chronic and acute stressors, which contributed to the patient's MDD (major depressive disorder), recurrent episode, severe (HCC) in order to reduce the risk of self-harm at discharge.  5. Discharge Planning:   -- Social work and case management to assist with discharge planning and identification of hospital follow-up needs prior to discharge  -- Estimated LOS: 1-3 days   -- Discharge Concerns: Need to establish a safety plan; Medication compliance and effectiveness  -- Discharge Goals: Return home with outpatient referrals for mental health follow-up including medication management/psychotherapy  I certify that inpatient services furnished can reasonably be expected to improve the patient's condition.   Signed: Peterson Ao, MD 01/26/2023, 12:33 PM

## 2023-01-26 NOTE — Group Note (Signed)
Date:  01/26/2023 Time:  11:35 PM  Group Topic/Focus:  Recovery Goals:   The focus of this group is to identify appropriate goals for recovery and establish a plan to achieve them.    Participation Level:    Participation Quality:    Affect:    Cognitive:    Insight:   Engagement in Group:    Modes of Intervention:    Additional Comments:  Did not attend  Carolyn Salas 01/26/2023, 11:35 PM

## 2023-01-26 NOTE — Group Note (Signed)
Date:  01/26/2023 Time:  9:14 AM  Group Topic/Focus:  Goals Group:   The focus of this group is to help patients establish daily goals to achieve during treatment and discuss how the patient can incorporate goal setting into their daily lives to aide in recovery.    Participation Level:  Did Not Attend  Additional Comments:  Pt chose not to attend group.   Donell Beers 01/26/2023, 9:14 AM

## 2023-01-26 NOTE — Plan of Care (Signed)
  Problem: Education: Goal: Verbalization of understanding the information provided will improve Outcome: Progressing   Problem: Education: Goal: Mental status will improve Outcome: Not Progressing

## 2023-01-26 NOTE — [facility] Group Notes (Signed)

## 2023-01-26 NOTE — Progress Notes (Signed)
   01/26/23 0530  15 Minute Checks  Location Bedroom  Visual Appearance Calm  Behavior Composed  Sleep (Behavioral Health Patients Only)  Calculate sleep? (Click Yes once per 24 hr at 0600 safety check) Yes  Documented sleep last 24 hours 7.25

## 2023-01-26 NOTE — Group Note (Signed)
Recreation Therapy Group Note   Group Topic:Stress Management  Group Date: 01/26/2023 Start Time: 1610 End Time: 0956 Facilitators: Bakary Bramer-McCall, LRT,CTRS Location: 300 Hall Dayroom   Group Topic: Stress Management  Goal Area(s) Addresses:  Patient will identify positive stress management techniques. Patient will identify benefits of using stress management post d/c.  Group Description: Meditation. LRT played a meditation from the Calm app that focused on taking in the characteristics of a mountain. It encouraged participates to envision how the mountain stands tall and endures whatever it's confronted with (ie. Changing weather, different seasons, time of day) and encouraged patients to take on that same attitude when they are faced with the challenges of life.   Education:  Stress Management, Discharge Planning.   Education Outcome: Acknowledges Education   Affect/Mood: N/A   Participation Level: Did not attend    Clinical Observations/Individualized Feedback:     Plan: Continue to engage patient in RT group sessions 2-3x/week.   Shaletha Humble-McCall, LRT,CTRS 01/26/2023 11:50 AM

## 2023-01-26 NOTE — Progress Notes (Signed)
Patient rated her anxiety level 1/10 and her depression level 0/10 with 10 being the highest and 0 none. Patient compliant with Zoloft and Abilify, declined Haldol, same discontinued. Patient declined to attend group therapy. No interaction observed with peers. Appetite good on shift. Safety maintained.  01/26/23 0825  Psych Admission Type (Psych Patients Only)  Admission Status Voluntary  Psychosocial Assessment  Patient Complaints Suspiciousness;Depression  Eye Contact Brief  Facial Expression Flat  Affect Depressed;Apprehensive;Irritable;Preoccupied  Speech Logical/coherent  Interaction Forwards little;Guarded;Isolative  Motor Activity Other (Comment) (WNL)  Appearance/Hygiene In scrubs  Behavior Characteristics Guarded;Irritable;Resistant to care  Mood Irritable;Depressed  Thought Process  Coherency WDL  Content Paranoia;Preoccupation  Delusions Paranoid  Perception WDL  Hallucination None reported or observed  Judgment Poor  Confusion None  Danger to Self  Current suicidal ideation? Denies  Self-Injurious Behavior No self-injurious ideation or behavior indicators observed or expressed   Agreement Not to Harm Self Yes  Description of Agreement Verbal  Danger to Others  Danger to Others None reported or observed  Danger to Others Abnormal  Harmful Behavior to others No threats or harm toward other people  Destructive Behavior No threats or harm toward property

## 2023-01-26 NOTE — BH IP Treatment Plan (Signed)
Interdisciplinary Treatment and Diagnostic Plan Update  01/26/2023 Time of Session: 9:00am (UPDATE) Carolyn Salas MRN: [id number]  Principal Diagnosis: MDD (major depressive disorder), recurrent episode, severe (HCC)  Secondary Diagnoses: Principal Problem:   MDD (major depressive disorder), recurrent episode, severe (HCC) Active Problems:   Suicidal ideations   Acute paranoia (HCC)   Current Medications:  Current Facility-Administered Medications  Medication Dose Route Frequency Provider Last Rate Last Admin   acetaminophen (TYLENOL) tablet 650 mg  650 mg Oral Q6H PRN Starkes-Perry, Juel Burrow, FNP       alum & mag hydroxide-simeth (MAALOX/MYLANTA) 200-200-20 MG/5ML suspension 30 mL  30 mL Oral Q4H PRN Starkes-Perry, Juel Burrow, FNP       ARIPiprazole (ABILIFY) tablet 5 mg  5 mg Oral Daily Rex Kras, MD   5 mg at 01/25/23 0805   benztropine (COGENTIN) tablet 1 mg  1 mg Oral BID PRN Rex Kras, MD       diphenhydrAMINE (BENADRYL) capsule 50 mg  50 mg Oral TID PRN Starkes-Perry, Juel Burrow, FNP       Or   diphenhydrAMINE (BENADRYL) injection 50 mg  50 mg Intramuscular TID PRN Starkes-Perry, Juel Burrow, FNP       feeding supplement (ENSURE ENLIVE / ENSURE PLUS) liquid 237 mL  237 mL Oral BID BM Attiah, Nadir, MD       haloperidol (HALDOL) tablet 2 mg  2 mg Oral BID Rex Kras, MD       haloperidol (HALDOL) tablet 5 mg  5 mg Oral TID PRN Rosario Adie, Juel Burrow, FNP       Or   haloperidol lactate (HALDOL) injection 5 mg  5 mg Intramuscular TID PRN Starkes-Perry, Juel Burrow, FNP       ibuprofen (ADVIL) tablet 400 mg  400 mg Oral Q6H PRN Starkes-Perry, Juel Burrow, FNP       LORazepam (ATIVAN) tablet 2 mg  2 mg Oral TID PRN Starkes-Perry, Juel Burrow, FNP       Or   LORazepam (ATIVAN) injection 2 mg  2 mg Intramuscular TID PRN Starkes-Perry, Juel Burrow, FNP       magnesium hydroxide (MILK OF MAGNESIA) suspension 30 mL  30 mL Oral Daily PRN Starkes-Perry, Juel Burrow, FNP       melatonin tablet 3 mg   3 mg Oral QHS PRN Starkes-Perry, Juel Burrow, FNP       sertraline (ZOLOFT) tablet 50 mg  50 mg Oral QPC supper Rex Kras, MD   50 mg at 01/25/23 1806   PTA Medications: No medications prior to admission.    Patient Stressors: Educational concerns    Patient Strengths: Average or above average intelligence  Supportive family/friends   Treatment Modalities: Medication Management, Group therapy, Case management,  1 to 1 session with clinician, Psychoeducation, Recreational therapy.   Physician Treatment Plan for Primary Diagnosis: MDD (major depressive disorder), recurrent episode, severe (HCC) Long Term Goal(s): Improvement in symptoms so as ready for discharge   Short Term Goals: Ability to identify changes in lifestyle to reduce recurrence of condition will improve Ability to disclose and discuss suicidal ideas Ability to identify and develop effective coping behaviors will improve Ability to maintain clinical measurements within normal limits will improve Compliance with prescribed medications will improve  Medication Management: Evaluate patient's response, side effects, and tolerance of medication regimen.  Therapeutic Interventions: 1 to 1 sessions, Unit Group sessions and Medication administration.  Evaluation of Outcomes: Progressing  Physician Treatment Plan for Secondary Diagnosis: Principal Problem:  MDD (major depressive disorder), recurrent episode, severe (HCC) Active Problems:   Suicidal ideations   Acute paranoia (HCC)  Long Term Goal(s): Improvement in symptoms so as ready for discharge   Short Term Goals: Ability to identify changes in lifestyle to reduce recurrence of condition will improve Ability to disclose and discuss suicidal ideas Ability to identify and develop effective coping behaviors will improve Ability to maintain clinical measurements within normal limits will improve Compliance with prescribed medications will improve     Medication  Management: Evaluate patient's response, side effects, and tolerance of medication regimen.  Therapeutic Interventions: 1 to 1 sessions, Unit Group sessions and Medication administration.  Evaluation of Outcomes: Progressing   RN Treatment Plan for Primary Diagnosis: MDD (major depressive disorder), recurrent episode, severe (HCC) Long Term Goal(s): Knowledge of disease and therapeutic regimen to maintain health will improve  Short Term Goals: Ability to remain free from injury will improve, Ability to verbalize frustration and anger appropriately will improve, Ability to participate in decision making will improve, Ability to verbalize feelings will improve, Ability to identify and develop effective coping behaviors will improve, and Compliance with prescribed medications will improve  Medication Management: RN will administer medications as ordered by provider, will assess and evaluate patient's response and provide education to patient for prescribed medication. RN will report any adverse and/or side effects to prescribing provider.  Therapeutic Interventions: 1 on 1 counseling sessions, Psychoeducation, Medication administration, Evaluate responses to treatment, Monitor vital signs and CBGs as ordered, Perform/monitor CIWA, COWS, AIMS and Fall Risk screenings as ordered, Perform wound care treatments as ordered.  Evaluation of Outcomes: Progressing   LCSW Treatment Plan for Primary Diagnosis: MDD (major depressive disorder), recurrent episode, severe (HCC) Long Term Goal(s): Safe transition to appropriate next level of care at discharge, Engage patient in therapeutic group addressing interpersonal concerns.  Short Term Goals: Engage patient in aftercare planning with referrals and resources, Increase social support, Increase emotional regulation, Facilitate acceptance of mental health diagnosis and concerns, Identify triggers associated with mental health/substance abuse issues, and  Increase skills for wellness and recovery  Therapeutic Interventions: Assess for all discharge needs, 1 to 1 time with Social worker, Explore available resources and support systems, Assess for adequacy in community support network, Educate family and significant other(s) on suicide prevention, Complete Psychosocial Assessment, Interpersonal group therapy.  Evaluation of Outcomes: Progressing   Progress in Treatment: Attending groups: Yes. Participating in groups: Yes. Taking medication as prescribed: Yes. Toleration medication: Yes. Family/Significant other contact made: No, will contact:   Ines Bloomer (husband) - [phone] Patient understands diagnosis: Yes. Discussing patient identified problems/goals with staff: Yes. Medical problems stabilized or resolved: Yes. Denies suicidal/homicidal ideation: Yes. Issues/concerns per patient self-inventory: Yes. Other: N/A   New problem(s) identified: No, Describe:  None Reported   New Short Term/Long Term Goal(s): medication stabilization, elimination of SI thoughts, development of comprehensive mental wellness plan.    Patient Goals:  Medication Stabilization   Discharge Plan or Barriers: :  Patient recently admitted. CSW will continue to follow and assess for appropriate referrals and possible discharge planning.    Reason for Continuation of Hospitalization: Depression Medication stabilization Suicidal ideation   Estimated Length of Stay: 5-7 Days  Last 3 [place] Suicide Severity Risk Score: Flowsheet Row Admission (Current) [facility]-Admission (Discharged) from 01/17/2023 in [facility]  C-SSRS RISK CATEGORY High Risk High Risk       Last  PHQ 2/9 Scores:     No data to display          Scribe for Treatment Team: Izell Simms, [place] 01/26/2023 9:37 AM

## 2023-01-27 ENCOUNTER — Inpatient Hospital Stay (HOSPITAL_COMMUNITY): Payer: BC Managed Care – PPO

## 2023-01-27 MED ORDER — ARIPIPRAZOLE 10 MG PO TABS
10.0000 mg | ORAL_TABLET | Freq: Every day | ORAL | Status: DC
  Administered 2023-01-28 – 2023-01-31 (×4): 10 mg via ORAL
  Filled 2023-01-27 (×6): qty 1

## 2023-01-27 MED ORDER — ARIPIPRAZOLE 10 MG PO TABS
10.0000 mg | ORAL_TABLET | Freq: Once | ORAL | Status: AC
  Administered 2023-01-27: 10 mg via ORAL
  Filled 2023-01-27: qty 1

## 2023-01-27 NOTE — Progress Notes (Signed)
[facility] MD Progress Note  01/27/2023 12:22 PM Carolyn Salas  MRN:  [id number]  Principal Problem: MDD (major depressive disorder), recurrent episode, severe (HCC) Diagnosis: Principal Problem:   MDD (major depressive disorder), recurrent episode, severe (HCC) Active Problems:   Suicidal ideations   Acute paranoia (HCC)   Reason for Admission:  Carolyn Salas is a 47 y.o. female with no past psychiatric history who presented to the [facility] voluntary from [facility] Emergency Department for evaluation and management of suicidal thoughts, recent attempt to kill herself by taking an overdose of acetaminophen cutting herself. Patient feels overwhelmed and reports a 3-4 week history of paranoia with persecutory delusions   (admitted on 01/19/2023, total  LOS: 8 days )  Chart Review from last 24 hours:  No episodes of agitation or aggression noted or reported, no as needed medication needed or given.  Patient has been refusing Abilify orally on and off refuse last night dosing and this morning dosing.  Receiving Zoloft 50 mg daily after supper.  Was reported by staff to be guarded at times withdrawn in her room, fair sleep reported.  Information Obtained Today During Patient Interview: The patient was seen and evaluated on the unit.  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  She reports her paranoia is not improving since admission she denies side effect to  current medication but occasionally reports feeling tired after taking it but she does not present sleepy and does not present with any sign of EPS or TD.  She was started on Haldol earlier during hospital stay but she refused.  She reports fair sleep and okay appetite, attending some groups with limited participation.  I did contact patient's husband Carolyn Salas at [phone] who confirmed that her symptoms started 1 to 2 weeks after Moderna COVID vaccine booster, he denies patient having any history of psychotic symptoms or paranoia, he confirms her paranoia reported from coworkers as well as from people spying on her through her device he notes that secondary to that she recently changed her telephone number and also got a new cell phone but when she was unable to do certain functions on her new cell phone "it made her more paranoid" he has been visiting her on daily basis since admission and reports yesterday he noticed little improvement including better eye contact and some improved fluency in speech and answering questions.  Plan of care was discussed with patient and her husband, patient is currently voluntary but given ongoing psychosis and paranoia as well as refusing medications patient meets the criteria for IVC, I discussed with patient that if she continues not to comply with oral treatment I will have to start IVC process and forced medication process to treat current symptoms.  When I came later in the day to discuss this with the patient she agrees to comply with Zoloft 50 mg after supper and to take Abilify 1 time daily after breakfast given she feels it  activates her.  We agreed that we will have her on Abilify 10 mg daily in the morning after breakfast, will follow.  I discussed with patient that I will continue to monitor and if she continues to refuse medicine we will start the plan noted above including IVC and forced medications ordered, will follow.  Past Psychiatric History:   Previous psych diagnoses: Denies Prior inpatient psychiatric treatment: Denies Prior outpatient psychiatric treatment: Denies Current psychiatric provider: Denies  Neuromodulation history: denies Current therapist: Denies Psychotherapy hx: Denies  History of suicide attempts: Denies History of homicide: Denies  Past Medical History: History reviewed. No pertinent past medical history.  Family Psychiatric History:  Reports pt's family history include mother with an unspecified mental illness where "the roles are reversed: her mother acts like a child and [patient] behaves as the mom" by taking care of whatever pt's mother needs. Potentially her biological father's side has mental disorder, possibly bipolar or schizophrenia but not sure. Denies any family history of suicide.   Social History:  no alcohol in house, pt never drank, smoked, used recreational substances or abused prescriptions. Denies abuse/trauma. States patient had an interesting childhood: She was raised by a couple, and patient believed her father figure was her biological dad. Unfortunately when he was trying to get custody of her, paternity test revealed he was not the father, so patient and sibling were sent to live with grandma.    Current Medications: Current Facility-Administered Medications  Medication Dose Route Frequency Provider Last Rate Last Admin   acetaminophen (TYLENOL) tablet 650 mg  650 mg Oral Q6H PRN Starkes-Perry, Juel Burrow, FNP       alum & mag hydroxide-simeth (MAALOX/MYLANTA) 200-200-20 MG/5ML suspension 30 mL  30 mL Oral Q4H PRN Starkes-Perry, Juel Burrow, FNP       [START ON 01/28/2023] ARIPiprazole (ABILIFY) tablet 10 mg  10 mg Oral Daily Tagen Milby, MD       ARIPiprazole (ABILIFY) tablet 10 mg  10 mg Oral Once Abbott Pao, Katleen Carraway, MD       benztropine (COGENTIN) tablet 1 mg  1 mg Oral BID PRN Rex Kras, MD       diphenhydrAMINE (BENADRYL) capsule 50 mg  50 mg Oral TID PRN Starkes-Perry, Juel Burrow, FNP        Or   diphenhydrAMINE (BENADRYL) injection 50 mg  50 mg Intramuscular TID PRN Starkes-Perry, Juel Burrow, FNP       feeding supplement (ENSURE ENLIVE / ENSURE PLUS) liquid 237 mL  237 mL Oral BID BM Destyn Schuyler, MD       haloperidol (HALDOL) tablet 5 mg  5 mg Oral TID PRN Starkes-Perry, Juel Burrow, FNP       Or   haloperidol lactate (HALDOL) injection 5 mg  5 mg Intramuscular TID PRN Starkes-Perry, Juel Burrow, FNP       ibuprofen (ADVIL) tablet 400 mg  400 mg Oral Q6H PRN Starkes-Perry, Juel Burrow, FNP       LORazepam (ATIVAN) tablet 2 mg  2 mg Oral TID PRN Starkes-Perry, Juel Burrow, FNP       Or   LORazepam (ATIVAN) injection 2 mg  2 mg Intramuscular TID PRN Starkes-Perry, Juel Burrow, FNP       magnesium hydroxide (MILK OF MAGNESIA) suspension 30 mL  30 mL Oral Daily PRN Starkes-Perry, Juel Burrow, FNP       melatonin tablet 3 mg  3 mg Oral QHS PRN Starkes-Perry, Juel Burrow, FNP       sertraline (ZOLOFT)  tablet 50 mg  50 mg Oral QPC supper Rex Kras, MD   50 mg at 01/26/23 1814    Lab Results: No results found for this or any previous visit (from the past 48 hour(s)).  Blood Alcohol level:  Lab Results  Component Value Date   ETH <10 01/17/2023    Metabolic Labs: Lab Results  Component Value Date   HGBA1C 5.4 01/23/2023   MPG 108.28 01/23/2023   No results found for: "PROLACTIN" Lab Results  Component Value Date   CHOL 129 01/23/2023   TRIG 49 01/23/2023   HDL 62 01/23/2023   CHOLHDL 2.1 01/23/2023   VLDL 10 01/23/2023   LDLCALC 57 01/23/2023    Physical Findings: AIMS: No  Psychiatric Specialty Exam: General Appearance:  Appropriate for Environment; Casual   Eye Contact:  Improved yet limited   Speech:  Decreased amount but seems to be more fluent than past few days  Volume:  Normal   Mood:  Euthymic   Affect:  Restricted  Thought Content:  Paranoid Ideation   Suicidal Thoughts:  Suicidal Thoughts: No   Homicidal Thoughts:  Homicidal Thoughts: No   Thought  Process:  Linear   Orientation:  Full (Time, Place and Person)     Memory:  Immediate Fair; Recent Fair   Judgment:  Poor   Insight:  Poor   Concentration:  Fair   Recall:  [employer] of Knowledge:  Fair   Language:  Fair   Psychomotor Activity:  No data recorded   Assets:  Desire for Improvement; Social Support; Housing; Vocational/Educational; Financial Resources/Insurance   Sleep:  No data recorded    Review of Systems Review of Systems  Constitutional:  Negative for chills and fever.  Respiratory:  Negative for cough.   Cardiovascular:  Negative for chest pain.  Gastrointestinal:  Negative for nausea and vomiting.  Neurological:  Negative for weakness and headaches.  Psychiatric/Behavioral:  Negative for depression, hallucinations, substance abuse and suicidal ideas. The patient is not nervous/anxious and does not have insomnia.   All other systems reviewed and are negative.   Vital Signs: Blood pressure (!) 110/92, pulse (!) 114, temperature 98.8 F (37.1 C), temperature source Oral, resp. rate 18, height 5\' 7"  (1.702 m), weight 79.9 kg, SpO2 97%. Body mass index is 27.6 kg/m. Physical Exam Vitals and nursing note reviewed.  Constitutional:      Appearance: Normal appearance.  Neurological:     General: No focal deficit present.     Mental Status: She is alert and oriented to person, place, and time.  Psychiatric:        Attention and Perception: She does not perceive auditory or visual hallucinations.        Mood and Affect: Mood is not anxious, depressed or elated. Affect is not flat.        Speech: Speech is not rapid and pressured.        Behavior: Behavior is cooperative.     Comments: Constricted affect    Assets  Assets: Desire for Improvement; Social Support; Housing; Vocational/Educational; Printmaker Plan Summary: Daily contact with patient to assess and evaluate symptoms and progress in treatment  and Medication management  Diagnoses / Active Problems: MDD (major depressive disorder), recurrent episode, severe (HCC) Principal Problem:   MDD (major depressive disorder), recurrent episode, severe (HCC) Active Problems:   Suicidal ideations   Acute paranoia (HCC)   ASSESSMENT: Carolyn Salas is a 47 y.o. female with no  past psychiatric history who presented to the [facility] voluntary from [facility] Emergency Department for evaluation and management of suicidal thoughts, recent attempt to kill herself by taking an overdose of acetaminophen or cutting herself. Patient feels overwhelmed and reports a 3-4 week history of paranoia with persecutory delusions   Patient continues to be paranoid (being watched, conversations monitored, people's conversations "staged"), irritable, withdrawn, and has not made significant improvements yet with regard to her mood and paranoia with Abilify 5 mg and Zoloft 50. Attempted trial of low dose haldol however, patient refusing to take meds. Will increase Abilify 5 mg to BID dosing. Will call husband for additional collateral, if he's noticing any improvements in patients paranoid behavior.   PLAN: Safety and Monitoring:  -- Involuntary admission to inpatient psychiatric unit for safety, stabilization and treatment  -- Daily contact with patient to assess and evaluate symptoms and progress in treatment  -- Patient's case to be discussed in multi-disciplinary team meeting  -- Observation Level : q15 minute checks  -- Vital signs:  q12 hours  -- Precautions: suicide, elopement, and assault  2. Interventions (medications, psychoeducation, etc):  Acute paranoia with persecutory delusions Rule out new onset psychosis following COVID-19 vaccination. Major depressive disorder Recent intentional acetaminophen overdose Suicidal ideations with recent suicide attempt by cutting               -Change Abilify to 10 mg daily after breakfast, for  psychosis, to improve compliance.  If Abilify is effective and well-tolerated for ongoing paranoia and psychosis, will consider starting Abilify Maintena prior to discharge to improve compliance and decrease risk of rehospitalization.              -Zoloft 50 mg in the evening to address depression symptoms   - F/U labs as noted below                -- The risks/benefits/side-effects/alternatives to this medication were discussed in detail with the patient and time was given for questions. The patient consents to medication trial.  -- Metabolic profile and EKG monitoring obtained while on an atypical antipsychotic (BMI: Lipid Panel: HbgA1c: QTc:) : pending EKG   Lab work ordered 10/1, recheck LFT and ammonia level, head CT to rule out organic reason of psychosis, heavy metal studies, ceruloplasmin, RPR   PRN medications for symptomatic management:              -- start acetaminophen 650 mg every 6 hours as needed for mild to moderate pain, fever, and headaches              -- start bismuth subsalicylate 524 mg oral chewable tablet every 3 hours as needed for diarrhea / loose stools              -- start aluminum-magnesium hydroxide + simethicone 30 mL every 4 hours as needed for heartburn or indigestion              -- start melatonin 3 mg bedtime as needed for insomnia             -- As needed agitation protocol in-place   The risks/benefits/side-effects/alternatives to the above medication were discussed in detail with the patient and time was given for questions. The patient consents to medication trial. FDA black box warnings, if present, were discussed.   The patient is agreeable with the medication plan, as above. We will monitor the patient's response to pharmacologic treatment, and adjust medications as necessary.  --  As needed agitation protocol in-place  The risks/benefits/side-effects/alternatives to the above medication were discussed in detail with the patient and time was  given for questions. The patient consents to medication trial. FDA black box warnings, if present, were discussed.  The patient is agreeable with the medication plan, as above. We will monitor the patient's response to pharmacologic treatment, and adjust medications as necessary.  3. Routine and other pertinent labs:             -- Metabolic profile:  BMI: Body mass index is 27.6 kg/m.  Prolactin: No results found for: "PROLACTIN"  Lipid Panel: Lab Results  Component Value Date   CHOL 129 01/23/2023   TRIG 49 01/23/2023   HDL 62 01/23/2023   CHOLHDL 2.1 01/23/2023   VLDL 10 01/23/2023   LDLCALC 57 01/23/2023    HbgA1c: Hgb A1c MFr Bld (%)  Date Value  01/23/2023 5.4    TSH: TSH (uIU/mL)  Date Value  01/18/2023 0.954    EKG monitoring: QTc: 397  4. Group Therapy:  -- Encouraged patient to participate in unit milieu and in scheduled group therapies   -- Short Term Goals: Ability to identify changes in lifestyle to reduce recurrence of condition, verbalize feelings, identify and develop effective coping behaviors, maintain clinical measurements within normal limits, and identify triggers associated with substance abuse/mental health issues will improve. Improvement in ability to disclose and discuss suicidal ideas, demonstrate self-control, and comply with prescribed medications.  -- Long Term Goals: Improvement in symptoms so as ready for discharge -- Patient is encouraged to participate in group therapy while admitted to the psychiatric unit. -- We will address other chronic and acute stressors, which contributed to the patient's MDD (major depressive disorder), recurrent episode, severe (HCC) in order to reduce the risk of self-harm at discharge.  5. Discharge Planning:   -- Social work and case management to assist with discharge planning and identification of hospital follow-up needs prior to discharge  -- Estimated LOS: 3-5 days   -- Discharge Concerns: Need to  establish a safety plan; Medication compliance and effectiveness  -- Discharge Goals: Return home with outpatient referrals for mental health follow-up including medication management/psychotherapy  I certify that inpatient services furnished can reasonably be expected to improve the patient's condition.   SignedSarita Bottom, MD 01/27/2023, 12:22 PM

## 2023-01-27 NOTE — Plan of Care (Signed)
Nurse discussed anxiety, depression and coping skills with patient.  

## 2023-01-27 NOTE — Progress Notes (Signed)
   01/26/23 2228  Psych Admission Type (Psych Patients Only)  Admission Status Voluntary  Psychosocial Assessment  Patient Complaints Suspiciousness;Isolation;Depression  Eye Contact Avoids  Facial Expression Flat  Affect Depressed;Flat;Apprehensive  Speech Logical/coherent  Interaction Guarded;Isolative  Motor Activity (S)  Other (Comment) (WDL)  Appearance/Hygiene In scrubs  Behavior Characteristics Guarded;Irritable  Mood Depressed  Thought Process  Coherency WDL  Content Paranoia  Delusions Paranoid  Perception WDL  Hallucination None reported or observed  Judgment Poor  Confusion None  Danger to Self  Current suicidal ideation? Denies  Self-Injurious Behavior No self-injurious ideation or behavior indicators observed or expressed   Agreement Not to Harm Self Yes  Description of Agreement verbal  Danger to Others  Danger to Others None reported or observed  Danger to Others Abnormal  Harmful Behavior to others No threats or harm toward other people  Destructive Behavior No threats or harm toward property

## 2023-01-27 NOTE — BHH Group Notes (Signed)
Adult Psychoeducational Group Note  Date:  01/27/2023 Time:  8:51 PM  Group Topic/Focus:  Wrap-Up Group:   The focus of this group is to help patients review their daily goal of treatment and discuss progress on daily workbooks.  Participation Level:  Did Not Attend  Christ Kick 01/27/2023, 8:51 PM

## 2023-01-27 NOTE — Group Note (Signed)
Recreation Therapy Group Note   Group Topic:Animal Assisted Therapy   Group Date: 01/27/2023 Start Time: 0946 End Time: 1030 Facilitators: Rionna Feltes-McCall, LRT,CTRS Location: [address] Dayroom   Animal-Assisted Activity (AAA) Program Checklist/Progress Notes Patient Eligibility Criteria Checklist & Daily Group note for Rec Tx Intervention  AAA/T Program Assumption of Risk Form signed by Patient/ or Parent Legal Guardian Yes  Patient understands his/her participation is voluntary Yes   Education: Charity fundraiser, Appropriate Animal Interaction    Education Outcome: Acknowledges education.    Affect/Mood: N/A   Participation Level: Did not attend    Clinical Observations/Individualized Feedback:     Plan: Continue to engage patient in RT group sessions 2-3x/week.   Krystalyn Kubota-McCall, LRT,CTRS 01/27/2023 1:14 PM

## 2023-01-27 NOTE — Group Note (Signed)
Date:  01/27/2023 Time:  2:05 PM  Group Topic/Focus:  Goals Group:   The focus of this group is to help patients establish daily goals to achieve during treatment and discuss how the patient can incorporate goal setting into their daily lives to aide in recovery. Orientation:   The focus of this group is to educate the patient on the purpose and policies of crisis stabilization and provide a format to answer questions about their admission.  The group details unit policies and expectations of patients while admitted.    Participation Level:  Did Not Attend  Participation Quality:   n/a  Affect:   n/a  Cognitive:   n/a  Insight: None  Engagement in Group:   n/a  Modes of Intervention:   n/a  Additional Comments:   Pt did not attend.  Edmund Hilda Yacob Wilkerson 01/27/2023, 2:05 PM

## 2023-01-27 NOTE — Group Note (Signed)
LCSW Group Therapy Note   Group Date: 01/27/2023 Start Time: 1100 End Time: 1200   Type of Therapy and Topic:  Group Therapy - Coping Skills For Anxiety and Depression  Participation Level:  None   Description of Group The focus of this group was to determine what healthy coping techniques would be helpful for group members in coping with anxiety and depression in their daily lives. The group began with patients introducing themselves and revealing one healthy and one unhealthy way they have coped with anxiety or depression in the past. Patients were guided through different techniques for coping with anxiety in a healthy way, including deep breathing, progressive muscle relaxation, challenging irrational thoughts, and mental imagery. Patients were then guided though different techniques for coping with depression in a healthy way, including behavioral activation, increasing social supports, focusing on positive experiences, and mindfulness. Differences between healthy and unhealthy coping techniques were pointed out when brought up by group members. Patients were asked to identify 2-3 healthy coping skills they would like to learn to use more effectively after being guided through these different techniques.These were explained, samples demonstrated, and resources shared for how to learn more at discharge.  Therapeutic Goals Patients learned that coping is what human beings do to deal with various situations in their lives. Patients learned various healthy coping techniques for anxiety and depression Patients determined 2-3 healthy coping skills they would like to become more familiar with and use more often. Patients provided support and ideas to each other.   Summary of Patient Progress:   Pt was in group   Therapeutic Modalities Cognitive Behavioral Therapy Motivational Interviewing Dialectical Behavioral Therapy  Carolyn [place], LCSW 01/27/2023  1:25 PM

## 2023-01-27 NOTE — Progress Notes (Signed)
D:  Patient denied SI and HI, contracts for safety.  Denied A/V hallucinations.  Denied pain. A:  Patient has discussed her medications with MD today.  Patient took her medications per MD instructions.  Emotional support and encouragement given patient. R:  Safety maintained with 15 minute  checks. Patient has been in bed most of the day.  Staff has encouraged patient to get out of bed and attend groups, etc.

## 2023-01-27 NOTE — Group Note (Unsigned)
Date:  01/27/2023 Time:  5:19 PM  Group Topic/Focus:  Goals Group:   The focus of this group is to help patients establish daily goals to achieve during treatment and discuss how the patient can incorporate goal setting into their daily lives to aide in recovery. Orientation:   The focus of this group is to educate the patient on the purpose and policies of crisis stabilization and provide a format to answer questions about their admission.  The group details unit policies and expectations of patients while admitted.     Participation Level:  {BHH PARTICIPATION UEAVW:09811}  Participation Quality:  {B[facility] PARTICIPATION QUALITY:22265}  Affect:  {BHH AFFECT:22266}  Cognitive:  {B[facility] COGNITIVE:22267}  Insight: {BHH Insight2:20797}  Engagement in Group:  {BHH ENGAGEMENT IN BJYNW:29562}  Modes of Intervention:  {BHH MODES OF INTERVENTION:22269}  Additional Comments:  ***  Raylyn Carton M Anagha Loseke 01/27/2023, 5:19 PM

## 2023-01-28 ENCOUNTER — Inpatient Hospital Stay (HOSPITAL_COMMUNITY)
Admit: 2023-01-28 | Discharge: 2023-01-28 | Disposition: A | Discharge status: Home / Self Care | Payer: BC Managed Care – PPO | Attending: Psychiatry

## 2023-01-28 NOTE — Group Note (Signed)
Date:  01/28/2023 Time:  12:13 PM  Group Topic/Focus:  Goals Group:   The focus of this group is to help patients establish daily goals to achieve during treatment and discuss how the patient can incorporate goal setting into their daily lives to aide in recovery.    Participation Level:  Active  Participation Quality:  Appropriate  Affect:  Appropriate  Cognitive:  Alert  Insight: Good  Engagement in Group:  Engaged  Modes of Intervention:  Discussion  Additional Comments:    Beckie Busing 01/28/2023, 12:13 PM

## 2023-01-28 NOTE — B[facility] Group Notes (Signed)
Spiritual care group facilitated by Chaplain Katy Denessa Cavan, BCC  Group focused on topic of strength. Group members reflected on what thoughts and feelings emerge when they hear this topic. They then engaged in facilitated dialog around how strength is present in their lives. This dialog focused on representing what strength had been to them in their lives (images and patterns given) and what they saw as helpful in their life now (what they needed / wanted).  Activity drew on narrative framework.  Patient Progress: Did not attend.  

## 2023-01-28 NOTE — Progress Notes (Signed)
[facility] MD Progress Note  01/28/2023 1:07 PM Carolyn Salas  MRN:  [id number]  Principal Problem: MDD (major depressive disorder), recurrent episode, severe (HCC) Diagnosis: Principal Problem:   MDD (major depressive disorder), recurrent episode, severe (HCC) Active Problems:   Suicidal ideations   Acute paranoia (HCC)   Reason for Admission:  Carolyn Salas is a 47 y.o. female with no past psychiatric history who presented to the [facility] voluntary from [facility] Emergency Department for evaluation and management of suicidal thoughts, recent attempt to kill herself by taking an overdose of acetaminophen cutting herself. Patient feels overwhelmed and reports a 3-4 week history of paranoia with persecutory delusions   (admitted on 01/19/2023, total  LOS: 9 days )  Chart Review from last 24 hours:  No episodes of agitation or aggression noted or reported, no as needed medication needed or given.  Patient is compliant with Zoloft 50 mg after supper with no problem, started compliance with Abilify 10 mg daily yesterday with no issues or side effects reported.  Information Obtained Today During Patient Interview: The patient was seen and evaluated on the unit.   Patient is lying down in bed alert and fully oriented, tells me she had a good night sleep, reports good appetite, continues to deny feeling depressed or anxious and describes "okay" mood denies passive or active SI intention or plan, denies HI or AVH she denies any paranoia or other delusions when asked, later in the interview when I started talking if she still feels that people are watching her from devices she answers "yes" but with further discussion she admits that it is not as overwhelming as it used to be and she is not distressed about it.  She had her CT scan completed later this morning, results pending  I did contact patient's husband Carolyn Salas at [phone] who visited with patient yesterday evening and  reports she continues to improve becoming more interactive during visit, fluent with conversation "she even starts conversations on her own she is definitely better" discussed with patient's husband if she continues to improve on Abilify will consider Abilify Maintena LAI to improve compliance after discharge.  He plans to stay with her for 2 weeks at home after discharge to ensure compliance and continuing stability.    Past Psychiatric History:  Previous psych diagnoses: Denies Prior inpatient psychiatric treatment: Denies Prior outpatient psychiatric treatment: Denies Current psychiatric provider: Denies  Neuromodulation history: denies Current therapist: Denies Psychotherapy hx: Denies  History of suicide attempts: Denies History of homicide: Denies  Past Medical History: History reviewed. No pertinent past medical history.  Family Psychiatric History:  Reports pt's family history include mother with an unspecified mental illness where "the roles are reversed: her mother acts like a child and [patient] behaves as the mom" by taking care of whatever pt's mother needs. Potentially her biological father's side has mental disorder, possibly bipolar or schizophrenia but not sure. Denies any family history of suicide.   Social History:  no alcohol in house, pt never drank, smoked, used recreational substances or abused prescriptions. Denies abuse/trauma. States patient had an interesting childhood: She was raised by a couple, and patient believed her father figure was her biological dad. Unfortunately when he was trying to get custody of her, paternity test revealed he was not the father, so patient and sibling were sent to live with grandma.    Current Medications: Current Facility-Administered Medications  Medication Dose Route Frequency Provider Last Rate Last Admin  acetaminophen (TYLENOL) tablet 650 mg  650 mg Oral Q6H PRN Starkes-Perry, Juel Burrow, FNP       alum & mag hydroxide-simeth  (MAALOX/MYLANTA) 200-200-20 MG/5ML suspension 30 mL  30 mL Oral Q4H PRN Starkes-Perry, Juel Burrow, FNP       ARIPiprazole (ABILIFY) tablet 10 mg  10 mg Oral Daily Abbott Pao, Xaivier Malay, MD   10 mg at 01/28/23 5409   benztropine (COGENTIN) tablet 1 mg  1 mg Oral BID PRN Rex Kras, MD       diphenhydrAMINE (BENADRYL) capsule 50 mg  50 mg Oral TID PRN Rosario Adie, Juel Burrow, FNP       Or   diphenhydrAMINE (BENADRYL) injection 50 mg  50 mg Intramuscular TID PRN Starkes-Perry, Juel Burrow, FNP       feeding supplement (ENSURE ENLIVE / ENSURE PLUS) liquid 237 mL  237 mL Oral BID BM Jaquaya Coyle, MD   237 mL at 01/27/23 1055   haloperidol (HALDOL) tablet 5 mg  5 mg Oral TID PRN Maryagnes Amos, FNP       Or   haloperidol lactate (HALDOL) injection 5 mg  5 mg Intramuscular TID PRN Starkes-Perry, Juel Burrow, FNP       ibuprofen (ADVIL) tablet 400 mg  400 mg Oral Q6H PRN Starkes-Perry, Juel Burrow, FNP       LORazepam (ATIVAN) tablet 2 mg  2 mg Oral TID PRN Starkes-Perry, Juel Burrow, FNP       Or   LORazepam (ATIVAN) injection 2 mg  2 mg Intramuscular TID PRN Starkes-Perry, Juel Burrow, FNP       magnesium hydroxide (MILK OF MAGNESIA) suspension 30 mL  30 mL Oral Daily PRN Starkes-Perry, Juel Burrow, FNP       melatonin tablet 3 mg  3 mg Oral QHS PRN Starkes-Perry, Juel Burrow, FNP       sertraline (ZOLOFT) tablet 50 mg  50 mg Oral QPC supper Rex Kras, MD   50 mg at 01/27/23 1731    Lab Results: No results found for this or any previous visit (from the past 48 hour(s)).  Blood Alcohol level:  Lab Results  Component Value Date   ETH <10 01/17/2023    Metabolic Labs: Lab Results  Component Value Date   HGBA1C 5.4 01/23/2023   MPG 108.28 01/23/2023   No results found for: "PROLACTIN" Lab Results  Component Value Date   CHOL 129 01/23/2023   TRIG 49 01/23/2023   HDL 62 01/23/2023   CHOLHDL 2.1 01/23/2023   VLDL 10 01/23/2023   LDLCALC 57 01/23/2023    Physical Findings: AIMS: No  Psychiatric  Specialty Exam: General Appearance:  Appropriate for Environment; Casual   Eye Contact:  Improved yet limited   Speech:  Decreased amount but seems to be more fluent than past few days  Volume:  Normal   Mood:  Euthymic   Affect:  Restricted but more fluent than yesterday  Thought Content:  Much less preoccupied with paranoia  Suicidal Thoughts:  No data recorded   Homicidal Thoughts:  No data recorded   Thought Process:  Linear   Orientation:  Full (Time, Place and Person)     Memory:  Immediate Fair; Recent Fair   Judgment:  Improved yet Limited, compliant with medication  Insight:  limited  Concentration:  Fair   Recall:  [employer] of Knowledge:  Fair   Language:  Fair   Psychomotor Activity:  No data recorded   Assets:  Desire for Improvement;  Social Support; Housing; Vocational/Educational; Financial Resources/Insurance   Sleep:  No data recorded    Review of Systems Review of Systems  Constitutional:  Negative for chills and fever.  Respiratory:  Negative for cough.   Cardiovascular:  Negative for chest pain.  Gastrointestinal:  Negative for nausea and vomiting.  Neurological:  Negative for weakness and headaches.  Psychiatric/Behavioral:  Negative for depression, hallucinations, substance abuse and suicidal ideas. The patient is not nervous/anxious and does not have insomnia.   All other systems reviewed and are negative.   Vital Signs: Blood pressure 115/85, pulse 73, temperature 98.6 F (37 C), temperature source Oral, resp. rate 18, height 5\' 7"  (1.702 m), weight 79.9 kg, SpO2 100%. Body mass index is 27.6 kg/m. Physical Exam Vitals and nursing note reviewed.  Constitutional:      Appearance: Normal appearance.  Neurological:     General: No focal deficit present.     Mental Status: She is alert and oriented to person, place, and time.  Psychiatric:        Attention and Perception: She does not perceive auditory or visual  hallucinations.        Mood and Affect: Mood is not anxious, depressed or elated. Affect is not flat.        Speech: Speech is not rapid and pressured.        Behavior: Behavior is cooperative.     Comments: Constricted affect    Assets  Assets: Desire for Improvement; Social Support; Housing; Vocational/Educational; Printmaker Plan Summary: Daily contact with patient to assess and evaluate symptoms and progress in treatment and Medication management  Diagnoses / Active Problems: MDD (major depressive disorder), recurrent episode, severe (HCC) Principal Problem:   MDD (major depressive disorder), recurrent episode, severe (HCC) Active Problems:   Suicidal ideations   Acute paranoia (HCC)   ASSESSMENT: Carolyn Salas is a 47 y.o. female with no past psychiatric history who presented to the [facility] voluntary from [facility] Emergency Department for evaluation and management of suicidal thoughts, recent attempt to kill herself by taking an overdose of acetaminophen or cutting herself. Patient feels overwhelmed and reports a 3-4 week history of paranoia with persecutory delusions   Patient continues to be paranoid (being watched, conversations monitored, people's conversations "staged"), irritable, withdrawn, and has not made significant improvements yet with regard to her mood and paranoia with Abilify 5 mg and Zoloft 50. Attempted trial of low dose haldol however, patient refusing to take meds. Will increase Abilify 5 mg to BID dosing. Will call husband for additional collateral, if he's noticing any improvements in patients paranoid behavior.   PLAN: Safety and Monitoring:  -- Involuntary admission to inpatient psychiatric unit for safety, stabilization and treatment  -- Daily contact with patient to assess and evaluate symptoms and progress in treatment  -- Patient's case to be discussed in multi-disciplinary team meeting  --  Observation Level : q15 minute checks  -- Vital signs:  q12 hours  -- Precautions: suicide, elopement, and assault  2. Interventions (medications, psychoeducation, etc):  Acute paranoia with persecutory delusions Rule out new onset psychosis following COVID-19 vaccination. Major depressive disorder Recent intentional acetaminophen overdose Suicidal ideations with recent suicide attempt by cutting               -Continue Abilify 10 mg daily after breakfast, for psychosis, to improve compliance.  If Abilify is effective and well-tolerated for ongoing paranoia and psychosis, will consider starting Abilify Maintena prior to  discharge to improve compliance and decrease risk of rehospitalization.              -Zoloft 50 mg in the evening to address depression symptoms   - F/U labs as noted below                -- The risks/benefits/side-effects/alternatives to this medication were discussed in detail with the patient and time was given for questions. The patient consents to medication trial.  -- Metabolic profile and EKG monitoring obtained while on an atypical antipsychotic (BMI: Lipid Panel: HbgA1c: QTc:) : pending EKG   Lab work ordered 10/1 currently pending, recheck LFT and ammonia level, head CT to rule out organic reason of psychosis, heavy metal studies, ceruloplasmin, RPR   PRN medications for symptomatic management:              -- start acetaminophen 650 mg every 6 hours as needed for mild to moderate pain, fever, and headaches              -- start bismuth subsalicylate 524 mg oral chewable tablet every 3 hours as needed for diarrhea / loose stools              -- start aluminum-magnesium hydroxide + simethicone 30 mL every 4 hours as needed for heartburn or indigestion              -- start melatonin 3 mg bedtime as needed for insomnia             -- As needed agitation protocol in-place   The risks/benefits/side-effects/alternatives to the above medication were discussed in  detail with the patient and time was given for questions. The patient consents to medication trial. FDA black box warnings, if present, were discussed.   The patient is agreeable with the medication plan, as above. We will monitor the patient's response to pharmacologic treatment, and adjust medications as necessary.  -- As needed agitation protocol in-place  The risks/benefits/side-effects/alternatives to the above medication were discussed in detail with the patient and time was given for questions. The patient consents to medication trial. FDA black box warnings, if present, were discussed.  The patient is agreeable with the medication plan, as above. We will monitor the patient's response to pharmacologic treatment, and adjust medications as necessary.  3. Routine and other pertinent labs:             -- Metabolic profile:  BMI: Body mass index is 27.6 kg/m.  Prolactin: No results found for: "PROLACTIN"  Lipid Panel: Lab Results  Component Value Date   CHOL 129 01/23/2023   TRIG 49 01/23/2023   HDL 62 01/23/2023   CHOLHDL 2.1 01/23/2023   VLDL 10 01/23/2023   LDLCALC 57 01/23/2023    HbgA1c: Hgb A1c MFr Bld (%)  Date Value  01/23/2023 5.4    TSH: TSH (uIU/mL)  Date Value  01/18/2023 0.954    EKG monitoring: QTc: 397  4. Group Therapy:  -- Encouraged patient to participate in unit milieu and in scheduled group therapies   -- Short Term Goals: Ability to identify changes in lifestyle to reduce recurrence of condition, verbalize feelings, identify and develop effective coping behaviors, maintain clinical measurements within normal limits, and identify triggers associated with substance abuse/mental health issues will improve. Improvement in ability to disclose and discuss suicidal ideas, demonstrate self-control, and comply with prescribed medications.  -- Long Term Goals: Improvement in symptoms so as ready for discharge -- Patient is encouraged  to participate in  group therapy while admitted to the psychiatric unit. -- We will address other chronic and acute stressors, which contributed to the patient's MDD (major depressive disorder), recurrent episode, severe (HCC) in order to reduce the risk of self-harm at discharge.  5. Discharge Planning:   -- Social work and case management to assist with discharge planning and identification of hospital follow-up needs prior to discharge  -- Estimated LOS: 3-5 days   -- Discharge Concerns: Need to establish a safety plan; Medication compliance and effectiveness  -- Discharge Goals: Return home with outpatient referrals for mental health follow-up including medication management/psychotherapy  I certify that inpatient services furnished can reasonably be expected to improve the patient's condition.   SignedSarita Bottom, MD 01/28/2023, 1:07 PM

## 2023-01-28 NOTE — [facility] Group Notes (Signed)
Adult Psychoeducational Group Note  Date:  01/28/2023 Time:  9:42 PM  Group Topic/Focus:  Narcotics  Anonymos  Participation Level:  Did Not Attend  Participation Quality:   Did not  Attend  Affect:   Did Not Attend  Cognitive:   Did Not Attend  Insight: None  Engagement in Group:   Did Not Attend  Modes of Intervention:   Did Not Attend  Additional Comments:  Pt did not attend the NA group due to being in their room.  Symantha Steeber, Sharen Counter 01/28/2023, 9:42 PM

## 2023-01-28 NOTE — Progress Notes (Signed)
Pt sent out for head CT

## 2023-01-28 NOTE — Progress Notes (Signed)
   01/28/23 0921  Psych Admission Type (Psych Patients Only)  Admission Status Voluntary  Psychosocial Assessment  Patient Complaints Depression  Eye Contact Avoids  Facial Expression Flat  Affect Depressed  Speech Logical/coherent  Interaction Minimal  Motor Activity Slow  Appearance/Hygiene Disheveled  Behavior Characteristics Guarded  Mood Depressed  Thought Process  Coherency WDL  Content Paranoia  Delusions Paranoid  Perception WDL  Hallucination None reported or observed  Judgment Poor  Confusion None  Danger to Self  Current suicidal ideation? Denies  Agreement Not to Harm Self Yes  Description of Agreement verbal  Danger to Others  Danger to Others None reported or observed  Danger to Others Abnormal  Harmful Behavior to others No threats or harm toward other people  Destructive Behavior No threats or harm toward property

## 2023-01-28 NOTE — Plan of Care (Signed)
°  Problem: Education: °Goal: Emotional status will improve °Outcome: Progressing °Goal: Mental status will improve °Outcome: Progressing °  °Problem: Activity: °Goal: Interest or engagement in activities will improve °Outcome: Progressing °  °

## 2023-01-28 NOTE — Progress Notes (Signed)
   01/27/23 2000  Psych Admission Type (Psych Patients Only)  Admission Status Voluntary  Psychosocial Assessment  Patient Complaints Depression;Sadness  Eye Contact Avoids  Facial Expression Flat  Affect Depressed  Speech Logical/coherent  Interaction Other (Comment)  Motor Activity Other (Comment)  Appearance/Hygiene Disheveled  Behavior Characteristics Guarded  Mood Depressed;Anxious;Suspicious  Thought Process  Coherency WDL  Content Paranoia  Delusions Paranoid  Perception WDL  Hallucination None reported or observed  Judgment Poor  Confusion None  Danger to Self  Current suicidal ideation? Denies (Denies)  Agreement Not to Harm Self Yes  Description of Agreement verbal  Danger to Others  Danger to Others None reported or observed  Danger to Others Abnormal  Harmful Behavior to others No threats or harm toward other people  Destructive Behavior No threats or harm toward property

## 2023-01-28 NOTE — Group Note (Signed)
Recreation Therapy Group Note   Group Topic:Other  Group Date: 01/28/2023 Start Time: 1405 End Time: 1450 Facilitators: Jaxie Racanelli-McCall, LRT,CTRS Location: [address] Dayroom   Activity Description/Intervention: Therapeutic Drumming. Patients with peers and staff were given the opportunity to engage in a leader facilitated [facility] Group Empowerment Drumming Circle with staff from the [employer], in partnership with [employer]. Teaching laboratory technician and trained [employer], Theodoro Doing leading with LRT observing and documenting intervention and pt response. This evidenced-based practice targets 7 areas of health and wellbeing in the human experience including: stress-reduction, exercise, self-expression, camaraderie/support, nurturing, spirituality, and music-making (leisure).   Goal Area(s) Addresses:  Patient will engage in pro-social way in music group.  Patient will follow directions of drum leader on the first prompt. Patient will demonstrate no behavioral issues during group.  Patient will identify if a reduction in stress level occurs as a result of participation in therapeutic drum circle.    Education: Leisure exposure, Pharmacologist, Musical expression, Discharge Planning   Affect/Mood: N/A   Participation Level: Did not attend    Clinical Observations/Individualized Feedback:    Plan: Continue to engage patient in RT group sessions 2-3x/week.   Chico Cawood-McCall, LRT,CTRS 01/28/2023 3:32 PM

## 2023-01-29 LAB — HEPATIC FUNCTION PANEL
ALT: 82 U/L — ABNORMAL HIGH (ref 0–44)
AST: 40 U/L (ref 15–41)
Albumin: 4.3 g/dL (ref 3.5–5.0)
Alkaline Phosphatase: 58 U/L (ref 38–126)
Bilirubin, Direct: 0.3 mg/dL — ABNORMAL HIGH (ref 0.0–0.2)
Indirect Bilirubin: 1 mg/dL — ABNORMAL HIGH (ref 0.3–0.9)
Total Bilirubin: 1.3 mg/dL — ABNORMAL HIGH (ref 0.3–1.2)
Total Protein: 8.2 g/dL — ABNORMAL HIGH (ref 6.5–8.1)

## 2023-01-29 LAB — AMMONIA: Ammonia: 41 umol/L — ABNORMAL HIGH (ref 9–35)

## 2023-01-29 LAB — VITAMIN B12: Vitamin B-12: 1459 pg/mL — ABNORMAL HIGH (ref 180–914)

## 2023-01-29 LAB — FOLATE: Folate: 7.8 ng/mL (ref >5.9)

## 2023-01-29 NOTE — Progress Notes (Signed)
   01/29/23 2200  Psych Admission Type (Psych Patients Only)  Admission Status Voluntary  Psychosocial Assessment  Patient Complaints Depression;Isolation  Eye Contact Brief  Facial Expression Flat  Affect Depressed;Flat  Speech Logical/coherent  Interaction Guarded;Cautious;Minimal  Motor Activity Slow  Appearance/Hygiene Unremarkable  Behavior Characteristics Guarded  Mood Depressed  Thought Process  Coherency WDL  Content WDL  Delusions None reported or observed  Perception WDL  Hallucination None reported or observed  Judgment Poor  Confusion None  Danger to Self  Current suicidal ideation? Denies  Self-Injurious Behavior No self-injurious ideation or behavior indicators observed or expressed   Agreement Not to Harm Self Yes  Description of Agreement verbal  Danger to Others  Danger to Others None reported or observed  Danger to Others Abnormal  Destructive Behavior No threats or harm toward property

## 2023-01-29 NOTE — Progress Notes (Signed)
[facility] MD Progress Note  01/29/2023 12:42 PM Carolyn Salas  MRN:  [id number]  Principal Problem: MDD (major depressive disorder), recurrent episode, severe (HCC) Diagnosis: Principal Problem:   MDD (major depressive disorder), recurrent episode, severe (HCC) Active Problems:   Suicidal ideations   Acute paranoia (HCC)   Reason for Admission:  Carolyn Salas is a 47 y.o. female with no past psychiatric history who presented to the [facility] voluntary from [facility] Emergency Department for evaluation and management of suicidal thoughts, recent attempt to kill herself by taking an overdose of acetaminophen cutting herself. Patient feels overwhelmed and reports a 3-4 week history of paranoia with persecutory delusions   (admitted on 01/19/2023, total  LOS: 10 days )  Chart Review from last 24 hours:  No episodes of agitation or aggression noted or reported, no as needed medication needed or given.  Patient is compliant with Zoloft 50 mg after supper with no problem, compliant with Abilify 10 mg daily with no issues or side effects reported.  Still limited group attendance, order was given to lock patient's room during group time to encourage group participation.  Information Obtained Today During Patient Interview: The patient was seen and evaluated on the unit.   Patient is lying down in bed alert and fully oriented continues to be withdrawn and displaying restricted affect, tells me she had a good night sleep, reports good appetite, continues to deny feeling depressed or anxious, continues to report regret of her suicide attempts, she describes "okay" mood denies passive or active SI intention or plan, denies HI or AVH she denies any paranoia or other delusions when asked, with further questioning she does admit to ongoing feeling of paranoia as noted previously but "I am not stressing about it I am not thinking about it all the time" when asking her how she will cope with the  stress after discharge she responds "I will be talking to somebody" referring to starting counseling.  She denies side effect to medications and agreed to comply with Abilify and Zoloft after discharge, I offered her to start Abilify maintainer LAI during hospital stay but she declines "I will take the pills" will continue to follow and offer again tomorrow.  She had her head CT completed yesterday and result was negative for any findings, lab work remains pending questionable if she refused Wednesday team this morning, they will come again tonight, patient was encouraged to comply with lab work.  Patient was encouraged to attend more groups and participate, will follow I did contact patient's husband Carolyn Salas at [phone] who continues to visit daily and continues to report improvement day by day, he agrees if she continues to improve gradually he is feeling comfortable with her being discharged on Saturday, discussed with husband to encourage patient to receive Abilify LAI.  Past Psychiatric History:  Previous psych diagnoses: Denies Prior inpatient psychiatric treatment: Denies Prior outpatient psychiatric treatment: Denies Current psychiatric provider: Denies  Neuromodulation history: denies Current therapist: Denies Psychotherapy hx: Denies  History of suicide attempts: Denies History of homicide: Denies  Past Medical History: History reviewed. No pertinent past medical history.  Family Psychiatric History:  Reports pt's family history include mother with an unspecified mental illness where "the roles are reversed: her mother acts like a child and [patient] behaves as the mom" by taking care of whatever pt's mother needs. Potentially her biological father's side has mental disorder, possibly bipolar or schizophrenia but not sure. Denies any family history of suicide.  Social History:  no alcohol in house, pt never drank, smoked, used recreational substances or abused prescriptions.  Denies abuse/trauma. States patient had an interesting childhood: She was raised by a couple, and patient believed her father figure was her biological dad. Unfortunately when he was trying to get custody of her, paternity test revealed he was not the father, so patient and sibling were sent to live with grandma.    Current Medications: Current Facility-Administered Medications  Medication Dose Route Frequency Provider Last Rate Last Admin   acetaminophen (TYLENOL) tablet 650 mg  650 mg Oral Q6H PRN Starkes-Perry, Juel Burrow, FNP       alum & mag hydroxide-simeth (MAALOX/MYLANTA) 200-200-20 MG/5ML suspension 30 mL  30 mL Oral Q4H PRN Starkes-Perry, Juel Burrow, FNP       ARIPiprazole (ABILIFY) tablet 10 mg  10 mg Oral Daily Abbott Pao, Kealan Buchan, MD   10 mg at 01/29/23 0755   benztropine (COGENTIN) tablet 1 mg  1 mg Oral BID PRN Rex Kras, MD       diphenhydrAMINE (BENADRYL) capsule 50 mg  50 mg Oral TID PRN Rosario Adie, Juel Burrow, FNP       Or   diphenhydrAMINE (BENADRYL) injection 50 mg  50 mg Intramuscular TID PRN Starkes-Perry, Juel Burrow, FNP       feeding supplement (ENSURE ENLIVE / ENSURE PLUS) liquid 237 mL  237 mL Oral BID BM Terika Pillard, MD   237 mL at 01/27/23 1055   haloperidol (HALDOL) tablet 5 mg  5 mg Oral TID PRN Maryagnes Amos, FNP       Or   haloperidol lactate (HALDOL) injection 5 mg  5 mg Intramuscular TID PRN Starkes-Perry, Juel Burrow, FNP       ibuprofen (ADVIL) tablet 400 mg  400 mg Oral Q6H PRN Starkes-Perry, Juel Burrow, FNP       LORazepam (ATIVAN) tablet 2 mg  2 mg Oral TID PRN Starkes-Perry, Juel Burrow, FNP       Or   LORazepam (ATIVAN) injection 2 mg  2 mg Intramuscular TID PRN Starkes-Perry, Juel Burrow, FNP       magnesium hydroxide (MILK OF MAGNESIA) suspension 30 mL  30 mL Oral Daily PRN Starkes-Perry, Juel Burrow, FNP       melatonin tablet 3 mg  3 mg Oral QHS PRN Starkes-Perry, Juel Burrow, FNP       sertraline (ZOLOFT) tablet 50 mg  50 mg Oral QPC supper Rex Kras, MD   50  mg at 01/28/23 1726    Lab Results: No results found for this or any previous visit (from the past 48 hour(s)).  Blood Alcohol level:  Lab Results  Component Value Date   ETH <10 01/17/2023    Metabolic Labs: Lab Results  Component Value Date   HGBA1C 5.4 01/23/2023   MPG 108.28 01/23/2023   No results found for: "PROLACTIN" Lab Results  Component Value Date   CHOL 129 01/23/2023   TRIG 49 01/23/2023   HDL 62 01/23/2023   CHOLHDL 2.1 01/23/2023   VLDL 10 01/23/2023   LDLCALC 57 01/23/2023    Physical Findings: AIMS: No  Psychiatric Specialty Exam: General Appearance:  Appropriate for Environment; Casual   Eye Contact:  Improved yet limited   Speech:  Decreased amount but seems to be more fluent than past few days  Volume:  Normal   Mood:  Euthymic   Affect:  Restricted but more fluent than yesterday  Thought Content:  Much less preoccupied with paranoia  Suicidal Thoughts:  No data recorded   Homicidal Thoughts:  No data recorded   Thought Process:  Linear   Orientation:  Full (Time, Place and Person)     Memory:  Immediate Fair; Recent Fair   Judgment:  Improved yet Limited, compliant with medication  Insight:  Improved yet limited  Concentration:  Fair   Recall:  [employer] of Knowledge:  Fair   Language:  Fair   Psychomotor Activity:  No data recorded   Assets:  Desire for Improvement; Social Support; Housing; Vocational/Educational; Financial Resources/Insurance   Sleep:  No data recorded    Review of Systems Review of Systems  Constitutional:  Negative for chills and fever.  Respiratory:  Negative for cough.   Cardiovascular:  Negative for chest pain.  Gastrointestinal:  Negative for nausea and vomiting.  Neurological:  Negative for weakness and headaches.  Psychiatric/Behavioral:  Negative for depression, hallucinations, substance abuse and suicidal ideas. The patient is not nervous/anxious and does not have  insomnia.   All other systems reviewed and are negative.   Vital Signs: Blood pressure 137/88, pulse 72, temperature 98.2 F (36.8 C), temperature source Oral, resp. rate 18, height 5\' 7"  (1.702 m), weight 79.9 kg, SpO2 100%. Body mass index is 27.6 kg/m. Physical Exam Vitals and nursing note reviewed.  Constitutional:      Appearance: Normal appearance.  Neurological:     General: No focal deficit present.     Mental Status: She is alert and oriented to person, place, and time.  Psychiatric:        Attention and Perception: She does not perceive auditory or visual hallucinations.        Mood and Affect: Mood is not anxious, depressed or elated. Affect is not flat.        Speech: Speech is not rapid and pressured.        Behavior: Behavior is cooperative.     Comments: Constricted affect    Assets  Assets: Desire for Improvement; Social Support; Housing; Vocational/Educational; Printmaker Plan Summary: Daily contact with patient to assess and evaluate symptoms and progress in treatment and Medication management  Diagnoses / Active Problems: MDD (major depressive disorder), recurrent episode, severe (HCC) Principal Problem:   MDD (major depressive disorder), recurrent episode, severe (HCC) Active Problems:   Suicidal ideations   Acute paranoia (HCC)   ASSESSMENT: Carolyn Salas is a 47 y.o. female with no past psychiatric history who presented to the [facility] voluntary from [facility] Emergency Department for evaluation and management of suicidal thoughts, recent attempt to kill herself by taking an overdose of acetaminophen or cutting herself. Patient feels overwhelmed and reports a 3-4 week history of paranoia with persecutory delusions   Patient continues to be paranoid (being watched, conversations monitored, people's conversations "staged"), irritable, withdrawn, and has not made significant improvements yet with regard to  her mood and paranoia with Abilify 5 mg and Zoloft 50. Attempted trial of low dose haldol however, patient refusing to take meds. Will increase Abilify 5 mg to BID dosing. Will call husband for additional collateral, if he's noticing any improvements in patients paranoid behavior.   PLAN: Safety and Monitoring:  -- Involuntary admission to inpatient psychiatric unit for safety, stabilization and treatment  -- Daily contact with patient to assess and evaluate symptoms and progress in treatment  -- Patient's case to be discussed in multi-disciplinary team meeting  -- Observation Level : q15 minute checks  -- Vital  signs:  q12 hours  -- Precautions: suicide, elopement, and assault  2. Interventions (medications, psychoeducation, etc):  Acute paranoia with persecutory delusions Rule out new onset psychosis following COVID-19 vaccination. Major depressive disorder Recent intentional acetaminophen overdose Suicidal ideations with recent suicide attempt by cutting               -Continue Abilify 10 mg daily after breakfast, for psychosis, to improve compliance.  If Abilify is effective and well-tolerated for ongoing paranoia and psychosis, will consider starting Abilify Maintena prior to discharge to improve compliance and decrease risk of rehospitalization.              -Zoloft 50 mg in the evening to address depression symptoms   - F/U labs as noted below                -- The risks/benefits/side-effects/alternatives to this medication were discussed in detail with the patient and time was given for questions. The patient consents to medication trial.  -- Metabolic profile and EKG monitoring obtained while on an atypical antipsychotic (BMI: Lipid Panel: HbgA1c: QTc:) : pending EKG   Lab work ordered 10/1 currently pending, recheck LFT and ammonia level, head CT to rule out organic reason of psychosis, heavy metal studies, ceruloplasmin, RPR  PRN medications for symptomatic management:               -- start acetaminophen 650 mg every 6 hours as needed for mild to moderate pain, fever, and headaches              -- start bismuth subsalicylate 524 mg oral chewable tablet every 3 hours as needed for diarrhea / loose stools              -- start aluminum-magnesium hydroxide + simethicone 30 mL every 4 hours as needed for heartburn or indigestion              -- start melatonin 3 mg bedtime as needed for insomnia             -- As needed agitation protocol in-place   The risks/benefits/side-effects/alternatives to the above medication were discussed in detail with the patient and time was given for questions. The patient consents to medication trial. FDA black box warnings, if present, were discussed.   The patient is agreeable with the medication plan, as above. We will monitor the patient's response to pharmacologic treatment, and adjust medications as necessary.  -- As needed agitation protocol in-place  The risks/benefits/side-effects/alternatives to the above medication were discussed in detail with the patient and time was given for questions. The patient consents to medication trial. FDA black box warnings, if present, were discussed.  The patient is agreeable with the medication plan, as above. We will monitor the patient's response to pharmacologic treatment, and adjust medications as necessary.  3. Routine and other pertinent labs:             -- Metabolic profile:  BMI: Body mass index is 27.6 kg/m.  Prolactin: No results found for: "PROLACTIN"  Lipid Panel: Lab Results  Component Value Date   CHOL 129 01/23/2023   TRIG 49 01/23/2023   HDL 62 01/23/2023   CHOLHDL 2.1 01/23/2023   VLDL 10 01/23/2023   LDLCALC 57 01/23/2023    HbgA1c: Hgb A1c MFr Bld (%)  Date Value  01/23/2023 5.4    TSH: TSH (uIU/mL)  Date Value  01/18/2023 0.954    EKG monitoring: QTc: 397  4. Group  Therapy:  -- Encouraged patient to participate in unit milieu and in scheduled  group therapies   -- Short Term Goals: Ability to identify changes in lifestyle to reduce recurrence of condition, verbalize feelings, identify and develop effective coping behaviors, maintain clinical measurements within normal limits, and identify triggers associated with substance abuse/mental health issues will improve. Improvement in ability to disclose and discuss suicidal ideas, demonstrate self-control, and comply with prescribed medications.  -- Long Term Goals: Improvement in symptoms so as ready for discharge -- Patient is encouraged to participate in group therapy while admitted to the psychiatric unit. -- We will address other chronic and acute stressors, which contributed to the patient's MDD (major depressive disorder), recurrent episode, severe (HCC) in order to reduce the risk of self-harm at discharge.  5. Discharge Planning:   -- Social work and case management to assist with discharge planning and identification of hospital follow-up needs prior to discharge  -- Estimated LOS: 3-5 days   -- Discharge Concerns: Need to establish a safety plan; Medication compliance and effectiveness  -- Discharge Goals: Return home with outpatient referrals for mental health follow-up including medication management/psychotherapy  I certify that inpatient services furnished can reasonably be expected to improve the patient's condition.   SignedSarita Bottom, MD 01/29/2023, 12:42 PM

## 2023-01-29 NOTE — Progress Notes (Signed)
   01/29/23 0755  Psych Admission Type (Psych Patients Only)  Admission Status Voluntary  Psychosocial Assessment  Patient Complaints Depression  Eye Contact Brief  Facial Expression Flat  Affect Flat  Speech Logical/coherent  Interaction Minimal  Motor Activity Slow  Appearance/Hygiene In scrubs  Behavior Characteristics Guarded  Mood Depressed  Thought Process  Coherency WDL  Content WDL  Delusions None reported or observed  Perception WDL  Hallucination None reported or observed  Judgment Poor  Confusion None  Danger to Self  Current suicidal ideation? Denies  Self-Injurious Behavior No self-injurious ideation or behavior indicators observed or expressed   Agreement Not to Harm Self Yes  Description of Agreement verbal  Danger to Others  Danger to Others None reported or observed  Danger to Others Abnormal  Harmful Behavior to others No threats or harm toward other people  Destructive Behavior No threats or harm toward property

## 2023-01-29 NOTE — Group Note (Signed)
LCSW Group Therapy Note   Group Date: 01/29/2023 Start Time: 1100 End Time: 1200   Type of Therapy and Topic:  Group Therapy: Boundaries  Participation Level:  Did Not Attend  Description of Group: This group will address the use of boundaries in their personal lives. Patients will explore why boundaries are important, the difference between healthy and unhealthy boundaries, and negative and postive outcomes of different boundaries and will look at how boundaries can be crossed.  Patients will be encouraged to identify current boundaries in their own lives and identify what kind of boundary is being set. Facilitators will guide patients in utilizing problem-solving interventions to address and correct types boundaries being used and to address when no boundary is being used. Understanding and applying boundaries will be explored and addressed for obtaining and maintaining a balanced life. Patients will be encouraged to explore ways to assertively make their boundaries and needs known to significant others in their lives, using other group members and facilitator for role play, support, and feedback.  Therapeutic Goals:  1.  Patient will identify areas in their life where setting clear boundaries could be  used to improve their life.  2.  Patient will identify signs/triggers that a boundary is not being respected. 3.  Patient will identify two ways to set boundaries in order to achieve balance in  their lives: 4.  Patient will demonstrate ability to communicate their needs and set boundaries  through discussion and/or role plays  Summary of Patient Progress:  No Show  Therapeutic Modalities:   Cognitive Behavioral Therapy Solution-Focused Therapy  Marinda Elk, LCSW 01/29/2023  3:02 PM

## 2023-01-29 NOTE — Progress Notes (Signed)
   01/28/23 2023  Psych Admission Type (Psych Patients Only)  Admission Status Voluntary  Psychosocial Assessment  Patient Complaints Depression  Eye Contact Avoids  Facial Expression Flat  Affect Depressed  Speech Logical/coherent  Interaction Forwards little;Minimal  Motor Activity Slow  Appearance/Hygiene Unremarkable  Behavior Characteristics Guarded  Mood Depressed  Thought Process  Coherency WDL  Content Paranoia  Delusions Paranoid  Perception WDL  Hallucination None reported or observed  Judgment Poor  Confusion None  Danger to Self  Current suicidal ideation? Denies  Self-Injurious Behavior No self-injurious ideation or behavior indicators observed or expressed   Agreement Not to Harm Self Yes  Description of Agreement verbal  Danger to Others  Danger to Others None reported or observed  Danger to Others Abnormal  Harmful Behavior to others No threats or harm toward other people  Destructive Behavior No threats or harm toward property

## 2023-01-29 NOTE — Group Note (Signed)
Date:  01/29/2023 Time:  9:15 AM  Group Topic/Focus:  Goals Group:   The focus of this group is to help patients establish daily goals to achieve during treatment and discuss how the patient can incorporate goal setting into their daily lives to aide in recovery.    Participation Level:  Did Not Attend  Participation Quality:   NA  Affect:   NA  Cognitive:   NA  Insight: None  Engagement in Group:   NA  Modes of Intervention:   NA  Additional Comments:  NA  Beckie Busing 01/29/2023, 9:15 AM

## 2023-01-30 ENCOUNTER — Encounter (HOSPITAL_COMMUNITY): Payer: Self-pay

## 2023-01-30 LAB — HEAVY METALS PROFILE, URINE
Arsenic (Total),U: NOT DETECTED ug/L (ref 0–9)
Creatinine(Crt),U: 2.5 g/L (ref 0.30–3.00)
Lead, Rand Ur: NOT DETECTED ug/L (ref 0–49)
Mercury, 24H Ur: 0 ug/(24.h) (ref 0–20)
Mercury, Ur: 1 ug/L (ref 0–19)
Mercury/Creat. Ratio: 0 ug/g{creat} (ref 0–4)
Total Volume: 40

## 2023-01-30 LAB — HIV ANTIBODY (ROUTINE TESTING W REFLEX): HIV Screen 4th Generation wRfx: NONREACTIVE

## 2023-01-30 LAB — RPR: RPR Ser Ql: NONREACTIVE

## 2023-01-30 NOTE — Progress Notes (Signed)
   01/30/23 2204  Psych Admission Type (Psych Patients Only)  Admission Status Voluntary  Psychosocial Assessment  Patient Complaints Depression  Eye Contact Brief  Facial Expression Flat  Affect Appropriate to circumstance  Speech Logical/coherent  Interaction Guarded;Minimal  Motor Activity Other (Comment) (WDL)  Appearance/Hygiene Unremarkable  Behavior Characteristics Appropriate to situation  Mood Depressed  Thought Process  Coherency WDL  Content WDL  Delusions None reported or observed  Perception WDL  Hallucination None reported or observed  Judgment Impaired  Confusion None  Danger to Self  Current suicidal ideation? Denies  Self-Injurious Behavior No self-injurious ideation or behavior indicators observed or expressed   Agreement Not to Harm Self Yes  Description of Agreement verbal  Danger to Others  Danger to Others None reported or observed  Danger to Others Abnormal  Harmful Behavior to others No threats or harm toward other people  Destructive Behavior No threats or harm toward property

## 2023-01-30 NOTE — Progress Notes (Signed)
[facility] MD Progress Note  01/30/2023 10:52 AM Carolyn Salas  MRN:  [id number]  Principal Problem: MDD (major depressive disorder), recurrent episode, severe (HCC) Diagnosis: Principal Problem:   MDD (major depressive disorder), recurrent episode, severe (HCC) Active Problems:   Suicidal ideations   Acute paranoia (HCC)   Reason for Admission:  Carolyn Salas is a 47 y.o. female with no past psychiatric history who presented to the [facility] voluntary from [facility] Emergency Department for evaluation and management of suicidal thoughts, recent attempt to kill herself by taking an overdose of acetaminophen cutting herself. Patient feels overwhelmed and reports a 3-4 week history of paranoia with persecutory delusions   (admitted on 01/19/2023, total  LOS: 11 days )  Chart Review from last 24 hours:  No episodes of agitation or aggression noted or reported, no as needed medication needed or given.  Patient is compliant with Zoloft 50 mg after supper with no problem, compliant with Abilify 10 mg daily with no issues or side effects reported.  Patient's room being blocked encouraged patient to attend groups and participate.  Information Obtained Today During Patient Interview: The patient was seen and evaluated on the unit.   Patient is lying down in bed alert and fully oriented continues to be withdrawn and displaying restricted affect, answers questions in linear manner, denies depressed mood or anxiety, continues to deny passive or active SI HI or AVH denies paranoia or other delusions presents more fluent regarding her thought process and speech also improved eye contact.  Seems to be much less preoccupied with her paranoia which led to her admission.  She is attending groups more often since yesterday and reports she is learning coping strategies to apply after discharge.  Husband visited daily and I did contact him today and he continues to report continuous improvement and  agrees with discharge plan tomorrow.  Patient continues to decline Abilify LAI "I will take the pill" denies side effect of medications, does not appear sleepy and does not display any sign consistent with EPS or TD agrees to comply with medications and follow-up appointments after discharge.  I discussed with patient and her husband recommendation for her to stay off work for 3 to 4 weeks after discharge, FMLA was completed to that extent with recommendation for her to be reassessed by outpatient psychiatric provider for further recommendations, patient and her husband both agree.   Past Psychiatric History:  Previous psych diagnoses: Denies Prior inpatient psychiatric treatment: Denies Prior outpatient psychiatric treatment: Denies Current psychiatric provider: Denies  Neuromodulation history: denies Current therapist: Denies Psychotherapy hx: Denies  History of suicide attempts: Denies History of homicide: Denies  Past Medical History: History reviewed. No pertinent past medical history.  Family Psychiatric History:  Reports pt's family history include mother with an unspecified mental illness where "the roles are reversed: her mother acts like a child and [patient] behaves as the mom" by taking care of whatever pt's mother needs. Potentially her biological father's side has mental disorder, possibly bipolar or schizophrenia but not sure. Denies any family history of suicide.   Social History:  no alcohol in house, pt never drank, smoked, used recreational substances or abused prescriptions. Denies abuse/trauma. States patient had an interesting childhood: She was raised by a couple, and patient believed her father figure was her biological dad. Unfortunately when he was trying to get custody of her, paternity test revealed he was not the father, so patient and sibling were sent to live with grandma.  Current Medications: Current Facility-Administered Medications  Medication Dose Route  Frequency Provider Last Rate Last Admin   acetaminophen (TYLENOL) tablet 650 mg  650 mg Oral Q6H PRN Starkes-Perry, Juel Burrow, FNP       alum & mag hydroxide-simeth (MAALOX/MYLANTA) 200-200-20 MG/5ML suspension 30 mL  30 mL Oral Q4H PRN Starkes-Perry, Juel Burrow, FNP       ARIPiprazole (ABILIFY) tablet 10 mg  10 mg Oral Daily Abbott Pao, Jaiyon Wander, MD   10 mg at 01/30/23 0759   benztropine (COGENTIN) tablet 1 mg  1 mg Oral BID PRN Rex Kras, MD       diphenhydrAMINE (BENADRYL) capsule 50 mg  50 mg Oral TID PRN Rosario Adie, Juel Burrow, FNP       Or   diphenhydrAMINE (BENADRYL) injection 50 mg  50 mg Intramuscular TID PRN Starkes-Perry, Juel Burrow, FNP       feeding supplement (ENSURE ENLIVE / ENSURE PLUS) liquid 237 mL  237 mL Oral BID BM Monteen Toops, MD   237 mL at 01/27/23 1055   haloperidol (HALDOL) tablet 5 mg  5 mg Oral TID PRN Maryagnes Amos, FNP       Or   haloperidol lactate (HALDOL) injection 5 mg  5 mg Intramuscular TID PRN Starkes-Perry, Juel Burrow, FNP       ibuprofen (ADVIL) tablet 400 mg  400 mg Oral Q6H PRN Starkes-Perry, Juel Burrow, FNP       LORazepam (ATIVAN) tablet 2 mg  2 mg Oral TID PRN Starkes-Perry, Juel Burrow, FNP       Or   LORazepam (ATIVAN) injection 2 mg  2 mg Intramuscular TID PRN Starkes-Perry, Juel Burrow, FNP       magnesium hydroxide (MILK OF MAGNESIA) suspension 30 mL  30 mL Oral Daily PRN Starkes-Perry, Juel Burrow, FNP       melatonin tablet 3 mg  3 mg Oral QHS PRN Starkes-Perry, Juel Burrow, FNP       sertraline (ZOLOFT) tablet 50 mg  50 mg Oral QPC supper Rex Kras, MD   50 mg at 01/29/23 1710    Lab Results:  Results for orders placed or performed during the hospital encounter of 01/19/23 (from the past 48 hour(s))  Hepatic function panel     Status: Abnormal   Collection Time: 01/29/23  6:08 PM  Result Value Ref Range   Total Protein 8.2 (H) 6.5 - 8.1 g/dL   Albumin 4.3 3.5 - 5.0 g/dL   AST 40 15 - 41 U/L   ALT 82 (H) 0 - 44 U/L   Alkaline Phosphatase 58 38 -  126 U/L   Total Bilirubin 1.3 (H) 0.3 - 1.2 mg/dL   Bilirubin, Direct 0.3 (H) 0.0 - 0.2 mg/dL   Indirect Bilirubin 1.0 (H) 0.3 - 0.9 mg/dL    Comment: Performed at [facility], [address], [place], [place] [address]  Ammonia     Status: Abnormal   Collection Time: 01/29/23  6:08 PM  Result Value Ref Range   Ammonia 41 (H) 9 - 35 umol/L    Comment: Performed at [facility], [address] [place], [place] [address]  Vitamin B12     Status: Abnormal   Collection Time: 01/29/23  6:08 PM  Result Value Ref Range   Vitamin B-12 1,459 (H) 180 - 914 pg/mL    Comment: (NOTE) This assay is not validated for testing neonatal or myeloproliferative syndrome specimens for Vitamin B12 levels. Performed at [facility], [address] [place], [place] [address]   Folate     Status: None   Collection Time: 01/29/23  6:08 PM  Result Value Ref Range   Folate 7.8 >5.9 ng/mL    Comment: Performed at [facility], [address] [place], [place] [address]  RPR     Status: None   Collection Time: 01/29/23  6:08 PM  Result Value Ref Range   RPR Ser Ql NON REACTIVE NON REACTIVE    Comment: Performed at [facility], [address], [place], [place] [address]  HIV Antibody (routine testing w rflx)     Status: None   Collection Time: 01/29/23  6:08 PM  Result Value Ref Range   HIV Screen 4th Generation wRfx Non Reactive Non Reactive    Comment: Performed at [facility], [address], [place], [place] [address]    Blood Alcohol level:  Lab Results  Component Value Date   ETH <10 01/17/2023    Metabolic Labs: Lab Results  Component Value Date   HGBA1C 5.4 01/23/2023   MPG 108.28 01/23/2023   No results found for: "PROLACTIN" Lab Results  Component Value Date   CHOL 129 01/23/2023   TRIG 49 01/23/2023   HDL 62 01/23/2023   CHOLHDL 2.1 01/23/2023   VLDL 10 01/23/2023   LDLCALC 57 01/23/2023     Physical Findings: AIMS: No  Psychiatric Specialty Exam: General Appearance:  Appropriate for Environment; Casual   Eye Contact:  Improved yet limited   Speech:  Decreased amount but seems to be more fluent than past few days  Volume:  Normal   Mood:  Euthymic   Affect:  Restricted but more fluent and more interactive  Thought Content:  Much less preoccupied with paranoia no paranoia or delusions noted at this time  Suicidal Thoughts:  No data recorded   Homicidal Thoughts:  No data recorded   Thought Process:  Linear   Orientation:  Full (Time, Place and Person)     Memory:  Immediate Fair; Recent Fair   Judgment:  Improved yet Limited, compliant with medication  Insight:  Improved yet limited  Concentration:  Fair   Recall:  [employer] of Knowledge:  Fair   Language:  Fair   Psychomotor Activity:  No data recorded   Assets:  Desire for Improvement; Social Support; Housing; Vocational/Educational; Surveyor, quantity Resources/Insurance   Sleep:  No data recorded    Review of Systems Review of Systems  Constitutional:  Negative for chills and fever.  Respiratory:  Negative for cough.   Cardiovascular:  Negative for chest pain.  Gastrointestinal:  Negative for nausea and vomiting.  Neurological:  Negative for weakness and headaches.  Psychiatric/Behavioral:  Negative for depression, hallucinations, substance abuse and suicidal ideas. The patient is not nervous/anxious and does not have insomnia.   All other systems reviewed and are negative.   Vital Signs: Blood pressure 137/88, pulse 72, temperature 98.2 F (36.8 C), temperature source Oral, resp. rate 18, height 5\' 7"  (1.702 m), weight 79.9 kg, SpO2 100%. Body mass index is 27.6 kg/m. Physical Exam Vitals and nursing note reviewed.  Constitutional:      Appearance: Normal appearance.  Neurological:     General: No focal deficit present.     Mental Status: She is alert and oriented to  person, place, and time.  Psychiatric:        Attention and Perception: She does not perceive auditory or visual hallucinations.  Mood and Affect: Mood is not anxious, depressed or elated. Affect is not flat.        Speech: Speech is not rapid and pressured.        Behavior: Behavior is cooperative.     Comments: Constricted affect    Assets  Assets: Desire for Improvement; Social Support; Housing; Vocational/Educational; Printmaker Plan Summary: Daily contact with patient to assess and evaluate symptoms and progress in treatment and Medication management  Diagnoses / Active Problems: MDD (major depressive disorder), recurrent episode, severe (HCC) Principal Problem:   MDD (major depressive disorder), recurrent episode, severe (HCC) Active Problems:   Suicidal ideations   Acute paranoia (HCC)   ASSESSMENT: Carolyn Salas is a 47 y.o. female with no past psychiatric history who presented to the [facility] voluntary from [facility] Emergency Department for evaluation and management of suicidal thoughts, recent attempt to kill herself by taking an overdose of acetaminophen or cutting herself. Patient feels overwhelmed and reports a 3-4 week history of paranoia with persecutory delusions   Patient continues to be paranoid (being watched, conversations monitored, people's conversations "staged"), irritable, withdrawn, and has not made significant improvements yet with regard to her mood and paranoia with Abilify 5 mg and Zoloft 50. Attempted trial of low dose haldol however, patient refusing to take meds. Will increase Abilify 5 mg to BID dosing. Will call husband for additional collateral, if he's noticing any improvements in patients paranoid behavior.   PLAN: Safety and Monitoring:  -- Involuntary admission to inpatient psychiatric unit for safety, stabilization and treatment  -- Daily contact with patient to assess and evaluate  symptoms and progress in treatment  -- Patient's case to be discussed in multi-disciplinary team meeting  -- Observation Level : q15 minute checks  -- Vital signs:  q12 hours  -- Precautions: suicide, elopement, and assault  2. Interventions (medications, psychoeducation, etc):  Acute paranoia with persecutory delusions Rule out new onset psychosis following COVID-19 vaccination. Major depressive disorder Recent intentional acetaminophen overdose Suicidal ideations with recent suicide attempt by cutting               -Continue Abilify 10 mg daily after breakfast per patient's request, for psychosis and to augment for antidepressant effect  She continues to decline Abilify LAI              -Zoloft 50 mg in the evening to address depression symptoms   - F/U labs as noted below                -- The risks/benefits/side-effects/alternatives to this medication were discussed in detail with the patient and time was given for questions. The patient consents to medication trial.  -- Metabolic profile and EKG monitoring obtained while on an atypical antipsychotic (BMI: Lipid Panel: HbgA1c: QTc:) : pending EKG   Lab work ordered 10/1 currently pending, recheck LFT and ammonia level, head CT to rule out organic reason of psychosis, heavy metal studies, ceruloplasmin, RPR  PRN medications for symptomatic management:              -- start acetaminophen 650 mg every 6 hours as needed for mild to moderate pain, fever, and headaches              -- start bismuth subsalicylate 524 mg oral chewable tablet every 3 hours as needed for diarrhea / loose stools              -- start aluminum-magnesium hydroxide +  simethicone 30 mL every 4 hours as needed for heartburn or indigestion              -- start melatonin 3 mg bedtime as needed for insomnia             -- As needed agitation protocol in-place   The risks/benefits/side-effects/alternatives to the above medication were discussed in detail with the  patient and time was given for questions. The patient consents to medication trial. FDA black box warnings, if present, were discussed.   The patient is agreeable with the medication plan, as above. We will monitor the patient's response to pharmacologic treatment, and adjust medications as necessary.  -- As needed agitation protocol in-place  The risks/benefits/side-effects/alternatives to the above medication were discussed in detail with the patient and time was given for questions. The patient consents to medication trial. FDA black box warnings, if present, were discussed.  The patient is agreeable with the medication plan, as above. We will monitor the patient's response to pharmacologic treatment, and adjust medications as necessary.  3. Routine and other pertinent labs:             -- Metabolic profile:  BMI: Body mass index is 27.6 kg/m.  Prolactin: No results found for: "PROLACTIN"  Lipid Panel: Lab Results  Component Value Date   CHOL 129 01/23/2023   TRIG 49 01/23/2023   HDL 62 01/23/2023   CHOLHDL 2.1 01/23/2023   VLDL 10 01/23/2023   LDLCALC 57 01/23/2023    HbgA1c: Hgb A1c MFr Bld (%)  Date Value  01/23/2023 5.4    TSH: TSH (uIU/mL)  Date Value  01/18/2023 0.954    EKG monitoring: QTc: 397  4. Group Therapy:  -- Encouraged patient to participate in unit milieu and in scheduled group therapies   -- Short Term Goals: Ability to identify changes in lifestyle to reduce recurrence of condition, verbalize feelings, identify and develop effective coping behaviors, maintain clinical measurements within normal limits, and identify triggers associated with substance abuse/mental health issues will improve. Improvement in ability to disclose and discuss suicidal ideas, demonstrate self-control, and comply with prescribed medications.  -- Long Term Goals: Improvement in symptoms so as ready for discharge -- Patient is encouraged to participate in group therapy while  admitted to the psychiatric unit. -- We will address other chronic and acute stressors, which contributed to the patient's MDD (major depressive disorder), recurrent episode, severe (HCC) in order to reduce the risk of self-harm at discharge.  5. Discharge Planning:   -- Social work and case management to assist with discharge planning and identification of hospital follow-up needs prior to discharge  -- Estimated LOS: 3-5 days   -- Discharge Concerns: Need to establish a safety plan; Medication compliance and effectiveness  -- Discharge Goals: Return home with outpatient referrals for mental health follow-up including medication management/psychotherapy  I certify that inpatient services furnished can reasonably be expected to improve the patient's condition.   SignedSarita Bottom, MD 01/30/2023, 10:52 AM

## 2023-01-30 NOTE — Group Note (Signed)
Recreation Therapy Group Note   Group Topic:Problem Solving  Group Date: 01/30/2023 Start Time: 0930 End Time: 0950 Facilitators: Toron Bowring-McCall, LRT,CTRS Location: [address] Dayroom   Group Topic: Communication, Team Building, Problem Solving  Goal Area(s) Addresses:  Patient will effectively work with peer towards shared goal.  Patient will identify skills used to make activity successful.  Patient will identify how skills used during activity can be applied to reach post d/c goals.   Intervention: STEM Activity- Glass blower/designer  Group Description: Tallest Pharmacist, community. In teams of 5-6, patients were given 11 craft pipe cleaners. Using the materials provided, patients were instructed to compete again the opposing team(s) to build the tallest free-standing structure from floor level. The activity was timed; difficulty increased by Clinical research associate as Production designer, theatre/television/film continued.  Systematically resources were removed with additional directions for example, placing one arm behind their back, working in silence, and shape stipulations. LRT facilitated post-activity discussion reviewing team processes and necessary communication skills involved in completion. Patients were encouraged to reflect how the skills utilized, or not utilized, in this activity can be incorporated to positively impact support systems post discharge.  Education: Pharmacist, community, Scientist, physiological, Discharge Planning   Education Outcome: Acknowledges education/In group clarification offered/Needs additional education.    Affect/Mood: N/A   Participation Level: Did not attend    Clinical Observations/Individualized Feedback:     Plan: Continue to engage patient in RT group sessions 2-3x/week.   Antinette Keough-McCall, LRT,CTRS 01/30/2023 12:00 PM

## 2023-01-30 NOTE — Group Note (Signed)
Date:  01/30/2023 Time:  10:26 AM  Group Topic/Focus:  Goals Group:   The focus of this group is to help patients establish daily goals to achieve during treatment and discuss how the patient can incorporate goal setting into their daily lives to aide in recovery.    Participation Level:  Active  Participation Quality:  Appropriate  Affect:  Appropriate  Cognitive:  Appropriate  Insight: Appropriate  Engagement in Group:  Engaged  Modes of Intervention:  Discussion  Additional Comments:  NA  Beckie Busing 01/30/2023, 10:26 AM

## 2023-01-30 NOTE — Progress Notes (Addendum)
Pt denied SI/HI/AVH this morning. Pt remains guarded/ isolative. Pt prompted to attend groups and meals throughout the day. Pt observed attending morning goals group. Pt refused to attend recreational therapy group due to "not feeling well from morning medication". Pt given scheduled medications as prescribed. Q15 min checks verified for safety. Patient verbally contracts for safety. Pt is safe on the unit.   01/30/23 0900  Psych Admission Type (Psych Patients Only)  Admission Status Voluntary  Psychosocial Assessment  Patient Complaints Depression;Isolation  Eye Contact Brief  Facial Expression Flat  Affect Depressed;Flat  Speech Logical/coherent  Interaction Minimal;Guarded;Cautious  Motor Activity Slow  Appearance/Hygiene Unremarkable  Behavior Characteristics Guarded  Mood Depressed  Thought Process  Coherency WDL  Content WDL  Delusions None reported or observed  Perception WDL  Hallucination None reported or observed  Judgment Impaired  Confusion None  Danger to Self  Current suicidal ideation? Denies  Description of Suicide Plan No plan  Self-Injurious Behavior No self-injurious ideation or behavior indicators observed or expressed   Agreement Not to Harm Self Yes  Description of Agreement Verbal  Danger to Others  Danger to Others None reported or observed  Danger to Others Abnormal  Harmful Behavior to others No threats or harm toward other people

## 2023-01-30 NOTE — Plan of Care (Signed)
  Problem: Education: Goal: Emotional status will improve Outcome: Progressing Goal: Mental status will improve Outcome: Progressing   

## 2023-01-30 NOTE — Group Note (Deleted)
Date:  01/30/2023 Time:  10:41 AM  Group Topic/Focus:  Goals Group:   The focus of this group is to help patients establish daily goals to achieve during treatment and discuss how the patient can incorporate goal setting into their daily lives to aide in recovery.     Participation Level:  {BHH PARTICIPATION ZOXWR:60454}  Participation Quality:  {BHH PARTICIPATION QUALITY:22265}  Affect:  {BHH AFFECT:22266}  Cognitive:  {BHH COGNITIVE:22267}  Insight: {BHH Insight2:20797}  Engagement in Group:  {BHH ENGAGEMENT IN UJWJX:91478}  Modes of Intervention:  {BHH MODES OF INTERVENTION:22269}  Additional Comments:  ***  Beckie Busing 01/30/2023, 10:41 AM

## 2023-01-30 NOTE — BH IP Treatment Plan (Signed)
Interdisciplinary Treatment and Diagnostic Plan Update  01/30/2023 Time of Session: 9:15AM ( UPDATE)  Carolyn Salas MRN: [id number]  Principal Diagnosis: MDD (major depressive disorder), recurrent episode, severe (HCC)  Secondary Diagnoses: Principal Problem:   MDD (major depressive disorder), recurrent episode, severe (HCC) Active Problems:   Suicidal ideations   Acute paranoia (HCC)   Current Medications:  Current Facility-Administered Medications  Medication Dose Route Frequency Provider Last Rate Last Admin   acetaminophen (TYLENOL) tablet 650 mg  650 mg Oral Q6H PRN Starkes-Perry, Juel Burrow, FNP       alum & mag hydroxide-simeth (MAALOX/MYLANTA) 200-200-20 MG/5ML suspension 30 mL  30 mL Oral Q4H PRN Starkes-Perry, Juel Burrow, FNP       ARIPiprazole (ABILIFY) tablet 10 mg  10 mg Oral Daily Abbott Pao, Nadir, MD   10 mg at 01/30/23 0759   benztropine (COGENTIN) tablet 1 mg  1 mg Oral BID PRN Rex Kras, MD       diphenhydrAMINE (BENADRYL) capsule 50 mg  50 mg Oral TID PRN Rosario Adie, Juel Burrow, FNP       Or   diphenhydrAMINE (BENADRYL) injection 50 mg  50 mg Intramuscular TID PRN Starkes-Perry, Juel Burrow, FNP       feeding supplement (ENSURE ENLIVE / ENSURE PLUS) liquid 237 mL  237 mL Oral BID BM Attiah, Nadir, MD   237 mL at 01/27/23 1055   haloperidol (HALDOL) tablet 5 mg  5 mg Oral TID PRN Maryagnes Amos, FNP       Or   haloperidol lactate (HALDOL) injection 5 mg  5 mg Intramuscular TID PRN Starkes-Perry, Juel Burrow, FNP       ibuprofen (ADVIL) tablet 400 mg  400 mg Oral Q6H PRN Starkes-Perry, Juel Burrow, FNP       LORazepam (ATIVAN) tablet 2 mg  2 mg Oral TID PRN Starkes-Perry, Juel Burrow, FNP       Or   LORazepam (ATIVAN) injection 2 mg  2 mg Intramuscular TID PRN Starkes-Perry, Juel Burrow, FNP       magnesium hydroxide (MILK OF MAGNESIA) suspension 30 mL  30 mL Oral Daily PRN Starkes-Perry, Juel Burrow, FNP       melatonin tablet 3 mg  3 mg Oral QHS PRN Starkes-Perry, Juel Burrow, FNP        sertraline (ZOLOFT) tablet 50 mg  50 mg Oral QPC supper Rex Kras, MD   50 mg at 01/29/23 1710   PTA Medications: No medications prior to admission.    Patient Stressors: Educational concerns    Patient Strengths: Average or above average intelligence  Supportive family/friends   Treatment Modalities: Medication Management, Group therapy, Case management,  1 to 1 session with clinician, Psychoeducation, Recreational therapy.   Physician Treatment Plan for Primary Diagnosis: MDD (major depressive disorder), recurrent episode, severe (HCC) Long Term Goal(s): Improvement in symptoms so as ready for discharge   Short Term Goals: Ability to identify changes in lifestyle to reduce recurrence of condition will improve Ability to disclose and discuss suicidal ideas Ability to identify and develop effective coping behaviors will improve Ability to maintain clinical measurements within normal limits will improve Compliance with prescribed medications will improve  Medication Management: Evaluate patient's response, side effects, and tolerance of medication regimen.  Therapeutic Interventions: 1 to 1 sessions, Unit Group sessions and Medication administration.  Evaluation of Outcomes: Progressing  Physician Treatment Plan for Secondary Diagnosis: Principal Problem:   MDD (major depressive disorder), recurrent episode, severe (HCC) Active Problems:   Suicidal ideations  Acute paranoia (HCC)  Long Term Goal(s): Improvement in symptoms so as ready for discharge   Short Term Goals: Ability to identify changes in lifestyle to reduce recurrence of condition will improve Ability to disclose and discuss suicidal ideas Ability to identify and develop effective coping behaviors will improve Ability to maintain clinical measurements within normal limits will improve Compliance with prescribed medications will improve     Medication Management: Evaluate patient's response, side  effects, and tolerance of medication regimen.  Therapeutic Interventions: 1 to 1 sessions, Unit Group sessions and Medication administration.  Evaluation of Outcomes: Progressing   RN Treatment Plan for Primary Diagnosis: MDD (major depressive disorder), recurrent episode, severe (HCC) Long Term Goal(s): Knowledge of disease and therapeutic regimen to maintain health will improve  Short Term Goals: Ability to remain free from injury will improve, Ability to verbalize frustration and anger appropriately will improve, Ability to participate in decision making will improve, Ability to verbalize feelings will improve, Ability to identify and develop effective coping behaviors will improve, and Compliance with prescribed medications will improve  Medication Management: RN will administer medications as ordered by provider, will assess and evaluate patient's response and provide education to patient for prescribed medication. RN will report any adverse and/or side effects to prescribing provider.  Therapeutic Interventions: 1 on 1 counseling sessions, Psychoeducation, Medication administration, Evaluate responses to treatment, Monitor vital signs and CBGs as ordered, Perform/monitor CIWA, COWS, AIMS and Fall Risk screenings as ordered, Perform wound care treatments as ordered.  Evaluation of Outcomes: Progressing   LCSW Treatment Plan for Primary Diagnosis: MDD (major depressive disorder), recurrent episode, severe (HCC) Long Term Goal(s): Safe transition to appropriate next level of care at discharge, Engage patient in therapeutic group addressing interpersonal concerns.  Short Term Goals: Engage patient in aftercare planning with referrals and resources, Increase social support, Increase emotional regulation, Facilitate acceptance of mental health diagnosis and concerns, Identify triggers associated with mental health/substance abuse issues, and Increase skills for wellness and  recovery  Therapeutic Interventions: Assess for all discharge needs, 1 to 1 time with Social worker, Explore available resources and support systems, Assess for adequacy in community support network, Educate family and significant other(s) on suicide prevention, Complete Psychosocial Assessment, Interpersonal group therapy.  Evaluation of Outcomes: Progressing   Progress in Treatment: Attending groups: Yes. Participating in groups: Yes. Taking medication as prescribed: Yes. Toleration medication: Yes. Family/Significant other contact made: Yes; Ines Bloomer (husband) - [phone] Patient understands diagnosis: Yes. Discussing patient identified problems/goals with staff: Yes. Medical problems stabilized or resolved: Yes. Denies suicidal/homicidal ideation: Yes. Issues/concerns per patient self-inventory: No Other: N/A   New problem(s) identified: No, Describe:  None Reported   New Short Term/Long Term Goal(s): medication stabilization, elimination of SI thoughts, development of comprehensive mental wellness plan.    Patient Goals:  Medication Stabilization   Discharge Plan or Barriers: :  Patient recently admitted. CSW will continue to follow and assess for appropriate referrals and possible discharge planning.    Reason for Continuation of Hospitalization: Depression Medication stabilization Suicidal ideation   Estimated Length of Stay: 1-2 days, pt is scheduled to DC tomorrow   Last 3 [place] Suicide Severity Risk Score: Flowsheet Row Ad[facility] (Current) [facility]-Admission (Discharged) from 01/17/2023 in [facility]  C-SSRS RISK CATEGORY High Risk High Risk       Last PHQ 2/9 Scores:     No data to display  Scribe for Treatment Team: Beather Arbour 01/30/2023 12:46 PM

## 2023-01-30 NOTE — [facility] Group Notes (Signed)
[facility] Group Notes:  (Nursing/MHT/Case Management/Adjunct)  Date:  01/30/2023  Time:  10:17 PM  Type of Therapy:   Wrap Up Group  Participation Level:  Did Not Attend   Carolyn Salas 01/30/2023, 10:17 PM

## 2023-01-31 DIAGNOSIS — F320 Major depressive disorder, single episode, mild: Secondary | ICD-10-CM

## 2023-01-31 DIAGNOSIS — F22 Delusional disorders: Secondary | ICD-10-CM

## 2023-01-31 LAB — HEPATIC FUNCTION PANEL
ALT: 61 U/L — ABNORMAL HIGH (ref 0–44)
AST: 34 U/L (ref 15–41)
Albumin: 3.8 g/dL (ref 3.5–5.0)
Alkaline Phosphatase: 50 U/L (ref 38–126)
Bilirubin, Direct: 0.2 mg/dL (ref 0.0–0.2)
Indirect Bilirubin: 1.2 mg/dL — ABNORMAL HIGH (ref 0.3–0.9)
Total Bilirubin: 1.4 mg/dL — ABNORMAL HIGH (ref 0.3–1.2)
Total Protein: 7.3 g/dL (ref 6.5–8.1)

## 2023-01-31 LAB — AMMONIA: Ammonia: 25 umol/L (ref 9–35)

## 2023-01-31 MED ORDER — SERTRALINE HCL 50 MG PO TABS: 50.0000 mg | ORAL_TABLET | Freq: Every day | ORAL | 0 refills | Status: AC

## 2023-01-31 MED ORDER — ARIPIPRAZOLE 10 MG PO TABS: 10.0000 mg | ORAL_TABLET | Freq: Every day | ORAL | 0 refills | Status: AC

## 2023-01-31 NOTE — Progress Notes (Signed)
  [facility] Adult Case Management Discharge Plan :  Will you be returning to the same living situation after discharge:  Yes,  Husband Carolyn Salas. At discharge, do you have transportation home?: Yes,  Husband picking her up. Do you have the ability to pay for your medications: Yes,  Insurance.  Release of information consent forms completed and in the chart;  Patient's signature needed at discharge.  Patient to Follow up at:  Follow-up Information     [facility] Follow up on 02/03/2023.   Why: You have a hospital follow up appointment for therapy and medication management services on 02/03/23 at 9:30 am.  This will be a Virtual telehealth appt. Contact information: [address]  [address] [place] [address] [phone]                 Next level of care provider has access to [place] Link: No  Safety Planning and Suicide Prevention discussed: Yes,  Completed with patients husband Carolyn Salas.   Has patient been referred to the Quitline?: Patient does not use tobacco/nicotine products  Patient has been referred for addiction treatment: No known substance use disorder.  Carolyn Salas, LCSWA 01/31/2023, 9:12 AM

## 2023-01-31 NOTE — BHH Suicide Risk Assessment (Signed)
[facility] Discharge Suicide Risk Assessment   Principal Problem: MDD (major depressive disorder), recurrent episode, severe (HCC) Discharge Diagnoses: Principal Problem:   MDD (major depressive disorder), recurrent episode, severe (HCC) Active Problems:   Suicidal ideations   Acute paranoia (HCC)   Total Time spent with patient: 45 minutes  Reason for admission: The patient was brought to [facility] from home by her husband on 9/21 after an intentional acetaminophen overdose on 9/18 and for intentionally cutting her neck and arms to inflict self-harm.   PTA Medications:  No medications prior to admission.   Hospital Course:   During the patient's hospitalization, patient had extensive initial psychiatric evaluation, and follow-up psychiatric evaluations every day.  Psychiatric diagnoses provided upon initial assessment: Acute paranoia with persecutory delusions Major Depressive Disorder Recent intentional acetaminophen overdose Suicidal ideations with recent suicide attempt by cutting Transaminitis, resolving Hypokalemia, resolving    Patient's psychiatric medications were adjusted on admission:             - Start Abilify 2 mg x 1 dose for symptoms of paranoia, delusions. -If tolerated, increase to 5 mg daily tomorrow.  -f/u EKG, A1c, CMP, vitamin D level             - Start Zoloft 25 mg x 1 dose. If tolerated continue to monitor for any adverse effects   During the hospitalization, other adjustments were made to the patient's psychiatric medication regimen: Zoloft was titrated from 25 to 50 mg daily to address depression and anxiety, Abilify was titrated from 2 mg gradually to 10 mg daily to help with augmenting antidepressant effect and also to help with paranoia reported.  Patient was continued on as needed Vistaril for anxiety and as needed trazodone for sleep which were not needed during this hospital stay  Patient's care was discussed during the interdisciplinary team meeting every  day during the hospitalization.  Patient continued to report improved depression and after first 1 to 2 days she denied and continues to deny any passive or active SI intention or plan, continues to regret her suicide attempt, in regard to patient's paranoia and delusions she did continue to mention send but it was noted that she is becoming much less preoccupied with her paranoia and delusions and much less distressed about it, she did remain withdrawn and secluded most of the time which improved gradually during hospital stay but not significantly, with encouragement she was able to attend some groups yet with limited participation.  Daily contact with patient's husband to update regarding treatment plan, he noted gradual and continuous improvement during this hospital stay with patient becoming more fluent in her speech and with improved eye contact during visits.  It was also noted during daily evaluations patient became more fluent regard her thought process and speech, more interactive during evaluations, improving eye contact, becoming much less preoccupied with her delusions and paranoia.  She was recommended after discharge to continue off work until 11 6 during which time she will have further follow-up for medication management with psychiatric provider to reassess regard further need for time off work or if she is becoming ready to restart working.  Patient's husband reported plan for him to stay with patient for 2 weeks after discharge to ensure compliance with medications and continued improvement.  Patient was offered Abilify LAI at time of discharge to improve compliance.  She declined. The patient denied having side effects to prescribed psychiatric medication except for reporting in the beginning of her hospitalization Zoloft causing tiredness  and Abilify at night causing poor sleep, medication dosing was adjusted for Abilify to be given as 1 dose in the morning and Zoloft to be given at night,  patient denied any side effects afterward.  Gradually, patient started adjusting to milieu. The patient was evaluated each day by a clinical provider to ascertain response to treatment. Improvement was noted by the patient's report of decreasing symptoms, improved sleep and appetite, affect, medication tolerance, behavior, and participation in unit programming.  Patient was asked each day to complete a self inventory noting mood, mental status, pain, new symptoms, anxiety and concerns.    Symptoms were reported as significantly decreased or resolved completely by discharge.   On day of discharge, patient was evaluated on 01/31/2023 the patient reports that their mood is stable. The patient denied having suicidal thoughts for more than 48 hours prior to discharge.  Patient denies having homicidal thoughts.  Patient denies having auditory hallucinations.  Patient denies any visual hallucinations or other symptoms of psychosis. The patient was motivated to continue taking medication with a goal of continued improvement in mental health.  Patient did agree that medications have been beneficial helping her mood and stress and also helping with her to become less preoccupied with her paranoia, she agrees to comply after discharge. The patient reports their target psychiatric symptoms of depression, anxiety and paranoia responded well to the psychiatric medications, and the patient reports overall benefit other psychiatric hospitalization. Supportive psychotherapy was provided to the patient. The patient also participated in regular group therapy while hospitalized. Coping skills, problem solving as well as relaxation therapies were also part of the unit programming.  Labs were reviewed with the patient, and abnormal results were discussed with the patient.  The patient is able to verbalize their individual safety plan to this provider.  Behavioral Events: None  Restraints: None  Groups: With encouragement  patient attended groups with limited participation.  Medications Changes: As above  Sleep  Good, improved during hospital stay  Musculoskeletal: Strength & Muscle Tone: within normal limits Gait & Station: normal Patient leans: N/A  Psychiatric Specialty Exam  General Appearance: appears at stated age, fairly dressed and groomed  Behavior: pleasant and cooperative  Psychomotor Activity:No psychomotor agitation or retardation noted   Eye Contact: Much improved since admission but remains occasionally limited Speech: Decreased amount and tone and volume but it was described to be her baseline   Mood: euthymic Affect: Restricted  Thought Process: linear, goal directed, no circumstantial or tangential thought process noted, no racing thoughts or flight of ideas Descriptions of Associations: intact Thought Content: Hallucinations: denies AH, VH , does not appear responding to stimuli Delusions: Continues to have some paranoia but becoming much less preoccupied with her paranoia and much less distressed about it Suicidal Thoughts: denies SI, intention, plan  Homicidal Thoughts: denies HI, intention, plan   Alertness/Orientation: alert and fully oriented  Insight: fair, improved Judgment: fair, improved  Memory: intact  Executive Functions  Concentration: intact  Attention Span: Fair Recall: intact Fund of Knowledge: fair   Art therapist  Concentration: intact Attention Span: Fair Recall: intact Fund of Knowledge: fair   Assets  Assets: Desire for Improvement; Social Support; Housing; Vocational/Educational; Health and safety inspector   Physical Exam: Physical Exam ROS Blood pressure 137/88, pulse 72, temperature 98.2 F (36.8 C), temperature source Oral, resp. rate 18, height 5\' 7"  (1.702 m), weight 79.9 kg, SpO2 100%. Body mass index is 27.6 kg/m.  Mental Status Per Nursing Assessment::   On Admission:  Suicidal ideation indicated by  patient  Demographic Factors:  NA  Loss Factors: NA  Historical Factors: Prior suicide attempts  Risk Reduction Factors:   Living with another person, especially a relative, Positive social support, and Positive therapeutic relationship  Continued Clinical Symptoms: Symptoms improved significantly during this hospital stay Depression:   Anhedonia Hopelessness Insomnia  Cognitive Features That Contribute To Risk:  None    Suicide Risk:  Minimal: No identifiable suicidal ideation.  Patients presenting with no risk factors but with morbid ruminations; may be classified as minimal risk based on the severity of the depressive symptoms   Follow-up Information     [facility] Follow up on 02/03/2023.   Why: You have a hospital follow up appointment for therapy and medication management services on 02/03/23 at 9:30 am.  This will be a Virtual telehealth appt. Contact information: [address]  [address] [place] [address] [phone]                 Plan Of Care/Follow-up recommendations:    Discharge recommendations:     Activity: as tolerated  Diet: heart healthy  # It is recommended to the patient to continue psychiatric medications as prescribed, after discharge from the hospital.     # It is recommended to the patient to follow up with your outpatient psychiatric provider and PCP.   # It was discussed with the patient, the impact of alcohol, drugs, tobacco have been there overall psychiatric and medical wellbeing, and total abstinence from substance use was recommended the patient.ed.   # Prescriptions provided or sent directly to preferred pharmacy at discharge. Patient agreeable to plan. Given opportunity to ask questions. Appears to feel comfortable with discharge.    # In the event of worsening symptoms, the patient is instructed to call the crisis hotline, 911 and or go to the nearest ED for appropriate evaluation and treatment of symptoms. To  follow-up with primary care provider for other medical issues, concerns and or health care needs   # Patient was discharged home with a plan to follow up as noted above.  -Follow-up with outpatient primary care doctor and other specialists -for management of chronic medical disease, including: Patient was recommended to follow-up with her primary care provider in regard follow-up for ammonia level which was slightly elevated during this hospital stay but trending down at time of discharge.  She was also recommended to follow-up with her primary care provider regarding follow-up for further lab results for heavy metal testing and ceruloplasmin to rule out Wilson disease.   Patient agrees with D/C instructions and plan.  The patient received suicide prevention pamphlet:  Yes Belongings returned:  Clothing and Valuables  Total Time Spent in Direct Patient Care:  I personally spent 45 minutes on the unit in direct patient care. The direct patient care time included face-to-face time with the patient, reviewing the patient's chart, communicating with other professionals, and coordinating care. Greater than 50% of this time was spent in counseling or coordinating care with the patient regarding goals of hospitalization, psycho-education, and discharge planning needs.   Tex Conroy 01/31/2023, 9:50 AM

## 2023-01-31 NOTE — Progress Notes (Signed)
   01/31/23 0530  15 Minute Checks  Location Bedroom  Visual Appearance Calm  Behavior Sleeping  Sleep (Behavioral Health Patients Only)  Calculate sleep? (Click Yes once per 24 hr at 0600 safety check) Yes  Documented sleep last 24 hours 8.5

## 2023-01-31 NOTE — Progress Notes (Signed)
Patient verbalizes readiness for discharge. All patient belongings returned to patient. Discharge instructions read and discussed with patient (appointments, medications, resources). Patient expressed gratitude for care provided. Patient discharged to lobby at 1200 where husband was waiting.

## 2023-01-31 NOTE — Discharge Summary (Signed)
Physician Discharge Summary Note  Patient:  Carolyn Salas is an 47 y.o., female MRN:  [id number] DOB:  1975-08-23 Patient phone:  [phone] (home)  Patient address:   [address] [place] [address],  Total Time spent with patient: 45 minutes  Date of Admission:  01/19/2023 Date of Discharge: 01/31/2023  Reason for Admission:  The patient was brought to [facility] from home by her husband on 9/21 after an intentional acetaminophen overdose on 9/18 and for intentionally cutting her neck and arms to inflict self-harm.   Principal Problem: MDD (major depressive disorder), recurrent episode, severe (HCC) Discharge Diagnoses: Principal Problem:   MDD (major depressive disorder), recurrent episode, severe (HCC) Active Problems:   Suicidal ideations   Acute paranoia (HCC)   Past Psychiatric History:  Previous psych diagnoses: Denies Prior inpatient psychiatric treatment: Denies Prior outpatient psychiatric treatment: Denies Current psychiatric provider: Denies   Neuromodulation history: denies   Current therapist: Denies Psychotherapy hx: Denies   History of suicide attempts: Denies History of homicide: Denies   Psychotropic medications: None  Past Medical History: History reviewed. No pertinent past medical history. History reviewed. No pertinent surgical history.  Family History: History reviewed. No pertinent family history. Family Psychiatric  History:  Psych Rx: Unknown Suicide: Unknown, denies Homicide: Denies Substance use family hx: Denies   Social History:  Social History   Substance and Sexual Activity  Alcohol Use Not Currently     Social History   Substance and Sexual Activity  Drug Use Never    Social History   Socioeconomic History   Marital status: Married    Spouse name: Not on file   Number of children: Not on file   Years of education: Not on file   Highest education level: Not on file  Occupational History   Not on file  Tobacco Use    Smoking status: Never   Smokeless tobacco: Never  Substance and Sexual Activity   Alcohol use: Not Currently   Drug use: Never   Sexual activity: Not on file  Other Topics Concern   Not on file  Social History Narrative   Not on file   Social Determinants of Health   Financial Resource Strain: Not on file  Food Insecurity: No Food Insecurity (01/19/2023)   Hunger Vital Sign    Worried About Running Out of Food in the Last Year: Never true    Ran Out of Food in the Last Year: Never true  Transportation Needs: No Transportation Needs (01/19/2023)   PRAPARE - Administrator, Civil Service (Medical): No    Lack of Transportation (Non-Medical): No  Physical Activity: Not on file  Stress: Not on file  Social Connections: Not on file   Place of birth and grew up where: [place], [place]; grew up in [place] Abuse: no history of abuse Marital Status: Married to Carolyn Salas for 15 years. Two children together. Raising godson for 8 years Sexual orientation: straight Children: 2 with current partner. 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 See collateral provided in HPI by husband for further details  Substance Use History:  Alcohol: never drinks Tobacco: never smoked, vaped, or chewed tobacco Cannabis (marijuana): never tried Cocaine: never tried Methamphetamines: never tried Psilocybin (mushrooms): never tried Ecstasy (MDMA /  molly): never tried LSD (acid): never tried Opiates (fentanyl / heroin): never tried Benzos (Xanax, Klonopin): never tried IV drug use: Never Prescribed meds abuse: Never   History of detox: Never History of rehab: Never  Hospital Course:   During the patient's  hospitalization, patient had extensive initial psychiatric evaluation, and follow-up psychiatric evaluations every day.   Psychiatric diagnoses provided upon initial assessment: Acute paranoia with persecutory delusions Major Depressive Disorder Recent intentional acetaminophen overdose Suicidal ideations with recent suicide attempt by cutting Transaminitis, resolving Hypokalemia, resolving     Patient's psychiatric medications were adjusted on admission:             - Start Abilify 2 mg x 1 dose for symptoms of paranoia, delusions. -If tolerated, increase to 5 mg daily tomorrow.  -f/u EKG, A1c, CMP, vitamin D level             - Start Zoloft 25 mg x 1 dose. If tolerated continue to monitor for any adverse effects    During the hospitalization, other adjustments were made to the patient's psychiatric medication regimen: Zoloft was titrated from 25 to 50 mg daily to address depression and anxiety, Abilify was titrated from 2 mg gradually to 10 mg daily to help with augmenting antidepressant effect and also to help with paranoia reported.  Patient was continued on as needed Vistaril for anxiety and as needed trazodone for sleep which were not needed during this hospital stay   Patient's care was discussed during the interdisciplinary team meeting every day during the hospitalization.   Patient continued to report improved depression and after first 1 to 2 days she denied and continues to deny any passive or active SI intention or plan, continues to regret her suicide attempt, in regard to patient's paranoia and delusions she did continue to mention send but it was noted that she is becoming much less preoccupied with her paranoia and delusions and much less distressed about it, she did remain withdrawn and secluded most of the time which improved gradually during hospital stay but not significantly, with encouragement she was able to attend some groups yet with limited participation.  Daily contact  with patient's husband to update regarding treatment plan, he noted gradual and continuous improvement during this hospital stay with patient becoming more fluent in her speech and with improved eye contact during visits.  It was also noted during daily evaluations patient became more fluent regard her thought process and speech, more interactive during evaluations, improving eye contact, becoming much less preoccupied with her delusions and paranoia.  She was recommended after discharge to continue off work until 11 6 during which time she will have further follow-up for medication management with psychiatric provider to reassess regard further need for time off work or if she is becoming ready to restart working.  Patient's husband reported plan for him to stay with patient for 2 weeks after discharge to ensure compliance with medications and continued improvement.  Patient was offered Abilify LAI at time of discharge to improve compliance.  She declined. The patient denied having side effects to prescribed psychiatric medication except for reporting in the beginning of her hospitalization Zoloft causing tiredness and Abilify at night causing poor sleep, medication dosing was adjusted for Abilify to be given as 1 dose in the morning and Zoloft to be given at night, patient denied any side effects afterward.   Gradually, patient started adjusting to milieu. The patient was evaluated each day by a clinical provider to ascertain  response to treatment. Improvement was noted by the patient's report of decreasing symptoms, improved sleep and appetite, affect, medication tolerance, behavior, and participation in unit programming.  Patient was asked each day to complete a self inventory noting mood, mental status, pain, new symptoms, anxiety and concerns.     Symptoms were reported as significantly decreased or resolved completely by discharge.    On day of discharge, patient was evaluated on 01/31/2023 the patient  reports that their mood is stable. The patient denied having suicidal thoughts for more than 48 hours prior to discharge.  Patient denies having homicidal thoughts.  Patient denies having auditory hallucinations.  Patient denies any visual hallucinations or other symptoms of psychosis. The patient was motivated to continue taking medication with a goal of continued improvement in mental health.  Patient did agree that medications have been beneficial helping her mood and stress and also helping with her to become less preoccupied with her paranoia, she agrees to comply after discharge. The patient reports their target psychiatric symptoms of depression, anxiety and paranoia responded well to the psychiatric medications, and the patient reports overall benefit other psychiatric hospitalization. Supportive psychotherapy was provided to the patient. The patient also participated in regular group therapy while hospitalized. Coping skills, problem solving as well as relaxation therapies were also part of the unit programming.   Labs were reviewed with the patient, and abnormal results were discussed with the patient.   The patient is able to verbalize their individual safety plan to this provider.   Behavioral Events: None   Restraints: None   Groups: With encouragement patient attended groups with limited participation.   Medications Changes: As above   Sleep  Good, improved during hospital stay  Physical Findings: AIMS: Facial and Oral Movements Muscles of Facial Expression: None, normal Lips and Perioral Area: None, normal Jaw: None, normal Tongue: None, normal,Extremity Movements Upper (arms, wrists, hands, fingers): None, normal Lower (legs, knees, ankles, toes): None, normal, Trunk Movements Neck, shoulders, hips: None, normal, Overall Severity Severity of abnormal movements (highest score from questions above): None, normal Incapacitation due to abnormal movements: None,  normal Patient's awareness of abnormal movements (rate only patient's report): No Awareness, Dental Status Current problems with teeth and/or dentures?: No Does patient usually wear dentures?: No  CIWA:    COWS:     Musculoskeletal: Strength & Muscle Tone: within normal limits Gait & Station: normal Patient leans: N/A   Psychiatric Specialty Exam:  General Appearance: appears at stated age, fairly dressed and groomed  Behavior: pleasant and cooperative  Psychomotor Activity:No psychomotor agitation or retardation noted   Eye Contact: good Speech: normal amount, tone, volume and latency   Mood: euthymic Affect: congruent, pleasant and interactive  Thought Process: linear, goal directed, no circumstantial or tangential thought process noted, no racing thoughts or flight of ideas Descriptions of Associations: intact Thought Content: Hallucinations: denies AH, VH , does not appear responding to stimuli Delusions: No paranoia or other delusions noted Suicidal Thoughts: denies SI, intention, plan  Homicidal Thoughts: denies HI, intention, plan   Alertness/Orientation: alert and fully oriented  Insight: fair, improved Judgment: fair, improved  Memory: intact  Executive Functions  Concentration: intact  Attention Span: Fair Recall: intact Fund of Knowledge: fair   Assets  Assets: Desire for Improvement; Social Support; Housing; Vocational/Educational; Health and safety inspector     Physical Exam:  Physical Exam Vitals and nursing note reviewed.  Constitutional:      Appearance: Normal appearance.  HENT:     Head:  Normocephalic and atraumatic.     Nose: Nose normal.  Eyes:     Extraocular Movements: Extraocular movements intact.  Pulmonary:     Effort: Pulmonary effort is normal.  Musculoskeletal:        General: Normal range of motion.     Cervical back: Normal range of motion.  Neurological:     General: No focal deficit present.     Mental  Status: She is alert and oriented to person, place, and time. Mental status is at baseline.  Psychiatric:        Mood and Affect: Mood normal.        Behavior: Behavior normal.        Thought Content: Thought content normal.        Judgment: Judgment normal.    Review of Systems  All other systems reviewed and are negative.  Blood pressure 137/88, pulse 72, temperature 98.2 F (36.8 C), temperature source Oral, resp. rate 18, height 5\' 7"  (1.702 m), weight 79.9 kg, SpO2 100%. Body mass index is 27.6 kg/m.   Social History   Tobacco Use  Smoking Status Never  Smokeless Tobacco Never   Tobacco Cessation:  N/A, patient does not currently use tobacco products   Blood Alcohol level:  Lab Results  Component Value Date   ETH <10 01/17/2023    Metabolic Disorder Labs:  Lab Results  Component Value Date   HGBA1C 5.4 01/23/2023   MPG 108.28 01/23/2023   No results found for: "PROLACTIN" Lab Results  Component Value Date   CHOL 129 01/23/2023   TRIG 49 01/23/2023   HDL 62 01/23/2023   CHOLHDL 2.1 01/23/2023   VLDL 10 01/23/2023   LDLCALC 57 01/23/2023    See Psychiatric Specialty Exam and Suicide Risk Assessment completed by Attending Physician prior to discharge.  Discharge destination:  Home with husband  Is patient on multiple antipsychotic therapies at discharge:  No   Has Patient had three or more failed trials of antipsychotic monotherapy by history:  No  Recommended Plan for Multiple Antipsychotic Therapies: NA  Discharge Instructions     Diet - low sodium heart healthy   Complete by: As directed    Increase activity slowly   Complete by: As directed       Allergies as of 01/31/2023       Reactions   Penicillins Other (See Comments)        Medication List     TAKE these medications      Indication  ARIPiprazole 10 MG tablet Commonly known as: ABILIFY Take 1 tablet (10 mg total) by mouth daily.  Indication: Major Depressive Disorder    sertraline 50 MG tablet Commonly known as: ZOLOFT Take 1 tablet (50 mg total) by mouth daily after supper.  Indication: Major Depressive Disorder        Follow-up Information     [facility] Follow up on 02/03/2023.   Why: You have a hospital follow up appointment for therapy and medication management services on 02/03/23 at 9:30 am.  This will be a Virtual telehealth appt. Contact information: [address]  [address] [place] [address] [phone]                 Discharge recommendations:    Activity: as tolerated  Diet: heart healthy  # It is recommended to the patient to continue psychiatric medications as prescribed, after discharge from the hospital.     # It is recommended to the patient to follow  up with your outpatient psychiatric provider and PCP.   # It was discussed with the patient, the impact of alcohol, drugs, tobacco have been there overall psychiatric and medical wellbeing, and total abstinence from substance use was recommended the patient.ed.   # Prescriptions provided or sent directly to preferred pharmacy at discharge. Patient agreeable to plan. Given opportunity to ask questions. Appears to feel comfortable with discharge.    # In the event of worsening symptoms, the patient is instructed to call the crisis hotline, 911 and or go to the nearest ED for appropriate evaluation and treatment of symptoms. To follow-up with primary care provider for other medical issues, concerns and or health care needs   # Patient was discharged home with a plan to follow up as noted above.  -Follow-up with outpatient primary care doctor and other specialists -for management of chronic medical disease, including: Patient was recommended to follow-up with her primary care provider in regard follow-up for ammonia level which was slightly elevated during this hospital stay but trending down at time of discharge. She was also recommended to follow-up with her primary  care provider regarding follow-up for further lab results for heavy metal testing and ceruloplasmin to rule out Wilson disease.    Patient agrees with D/C instructions and plan.   The patient received suicide prevention pamphlet:  Yes Belongings returned:  Clothing and Valuables  Total Time Spent in Direct Patient Care:  I personally spent 45 minutes on the unit in direct patient care. The direct patient care time included face-to-face time with the patient, reviewing the patient's chart, communicating with other professionals, and coordinating care. Greater than 50% of this time was spent in counseling or coordinating care with the patient regarding goals of hospitalization, psycho-education, and discharge planning needs.    SignedSarita Bottom, MD 01/31/2023, 10:04 AM

## 2023-02-01 LAB — CERULOPLASMIN: Ceruloplasmin: 30.3 mg/dL (ref 19.0–39.0)

## 2024-02-08 ENCOUNTER — Encounter: Payer: Self-pay | Admitting: Podiatry

## 2024-02-08 ENCOUNTER — Ambulatory Visit: Admitting: Podiatry

## 2024-02-08 DIAGNOSIS — L603 Nail dystrophy: Secondary | ICD-10-CM

## 2024-02-08 NOTE — Progress Notes (Signed)
  Subjective:  Patient ID: Carolyn Salas, female    DOB: October 04, 1975,   MRN: [id number]  Chief Complaint  Patient presents with   Nail Problem    Rm23 Bilateral discoloration in toenails both great toes/ 5 days no pain /dry skin on right foot using desenex for the foot and fungi nail for toenalis .    48 y.o. female presents for concern  of bilateral toenail discoloration that has been ongoign for about 5 days or at least when she noticed it. Has tried desonex and funginail without much improvement.  Relates she recently wore some shoes that were too small for her.  . Denies any other pedal complaints. Denies n/v/f/c.   History reviewed. No pertinent past medical history.  Objective:  Physical Exam: Vascular: DP/PT pulses 2/4 bilateral. CFT <3 seconds. Normal hair growth on digits. No edema.  Skin. No lacerations or abrasions bilateral feet. Bialteral hallux nails proximal discolroation noted black and red with some spreading under the cuticle.   Musculoskeletal: MMT 5/5 bilateral lower extremities in DF, PF, Inversion and Eversion. Deceased ROM in DF of ankle joint.  Neurological: Sensation intact to light touch.   Assessment:   1. Onychodystrophy      Plan:  Patient was evaluated and treated and all questions answered. -Examined patient -Discussed treatment options for painful dystrophic nails  -Dsicussed likely just brusing underlying nail and should grow out in time from wearing small shoes.  -Did discuss to continue to monitor and if does not see grown and notices worsening of discoloration to return. Disucssed we would have to remove nail and send to lab for analysis.  -Patient to return as needed   Asberry Failing, DPM
